# Patient Record
Sex: Male | Born: 1935 | Race: White | Hispanic: No | Marital: Married | Smoking: Former smoker
Health system: Southern US, Community
[De-identification: ages and names within clinical notes are randomized; demographics above are authoritative.]

## PROBLEM LIST (undated history)

## (undated) DIAGNOSIS — C4490 Unspecified malignant neoplasm of skin, unspecified: Secondary | ICD-10-CM

## (undated) DIAGNOSIS — C801 Malignant (primary) neoplasm, unspecified: Secondary | ICD-10-CM

## (undated) DIAGNOSIS — I4891 Unspecified atrial fibrillation: Secondary | ICD-10-CM

## (undated) DIAGNOSIS — I10 Essential (primary) hypertension: Secondary | ICD-10-CM

## (undated) DIAGNOSIS — I639 Cerebral infarction, unspecified: Secondary | ICD-10-CM

## (undated) DIAGNOSIS — I2510 Atherosclerotic heart disease of native coronary artery without angina pectoris: Secondary | ICD-10-CM

## (undated) DIAGNOSIS — E785 Hyperlipidemia, unspecified: Secondary | ICD-10-CM

## (undated) HISTORY — PX: FRACTURE SURGERY: SHX138

---

## 2008-09-17 ENCOUNTER — Inpatient Hospital Stay: Payer: Self-pay | Admitting: Internal Medicine

## 2009-07-27 ENCOUNTER — Inpatient Hospital Stay: Payer: Self-pay | Admitting: Internal Medicine

## 2010-09-14 ENCOUNTER — Ambulatory Visit: Payer: Self-pay | Admitting: Ophthalmology

## 2010-10-19 ENCOUNTER — Ambulatory Visit: Payer: Self-pay | Admitting: Ophthalmology

## 2011-07-19 IMAGING — CT CT CHEST W/O CM
1 series · 15 of 33 positions shown, 19 images · non-contrast
Comparison: none

REASON FOR EXAM: questionable b/l infiltrates
COMMENTS:

[Series 2: soft tissue · axial · 0.75mm/px · z∈[-892,-618]mm · 15 of 65 slices shown, 19 images]
[im 5/65  mediastinal]
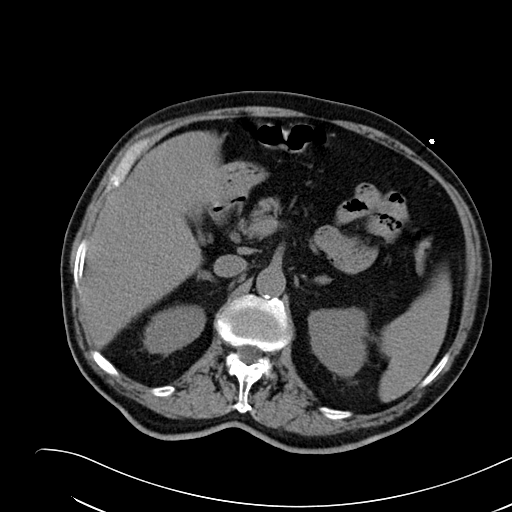
[im 5/65  lung]
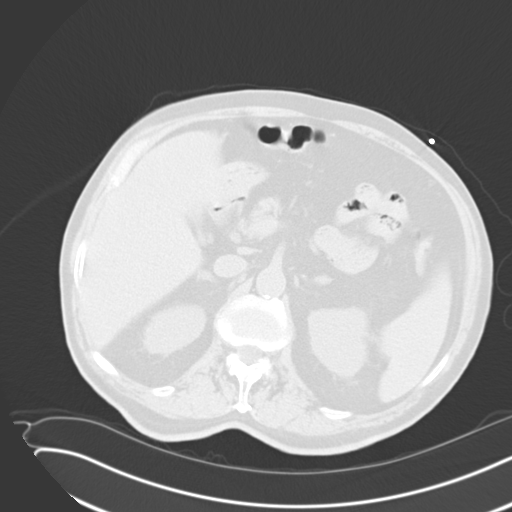
[im 10/65  lung]
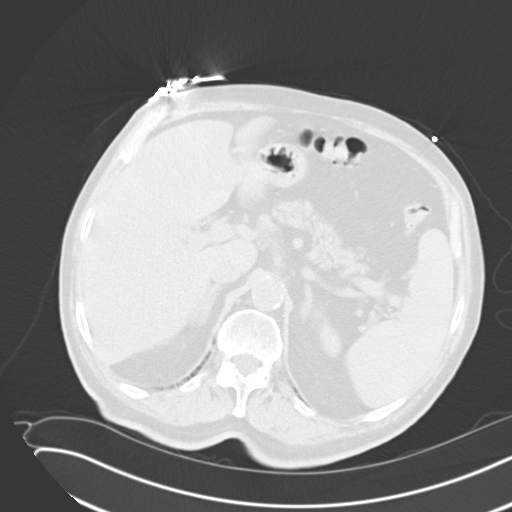
[im 13/65  lung]
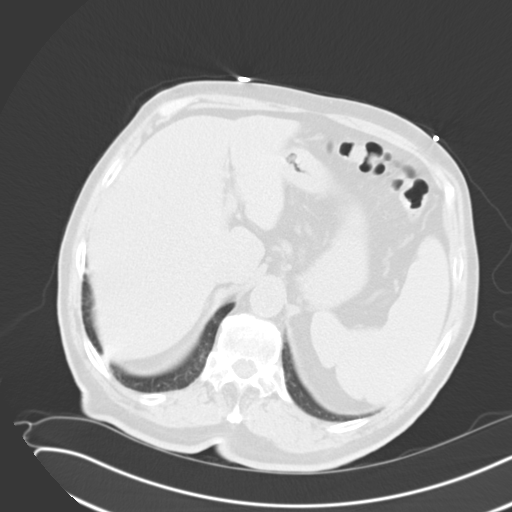
[im 17/65  lung]
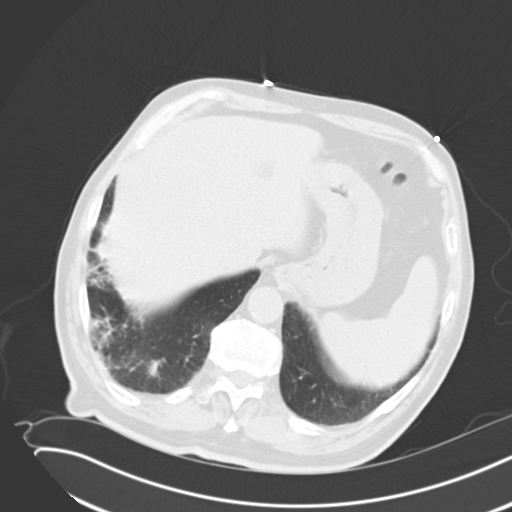
[im 22/65  mediastinal]
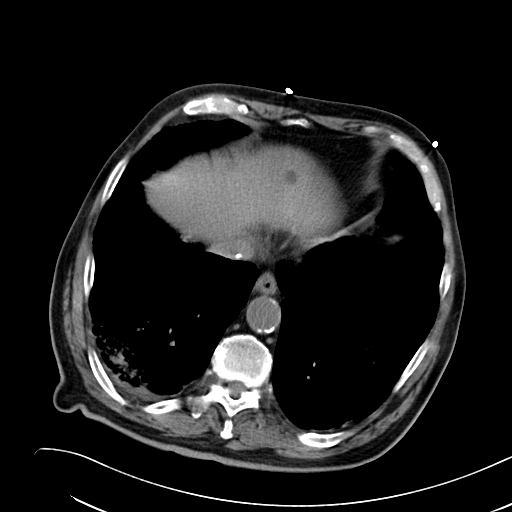
[im 22/65  lung]
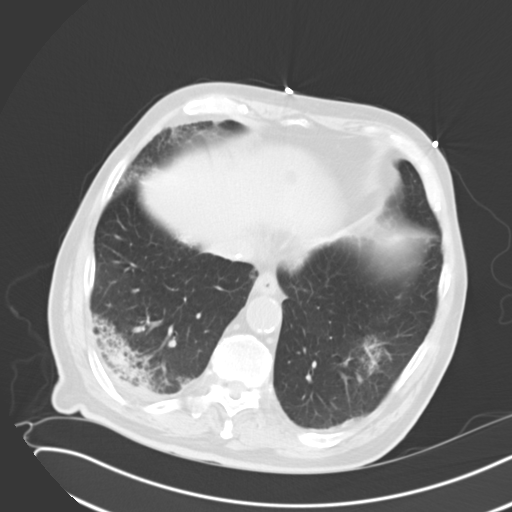
[im 26/65  lung]
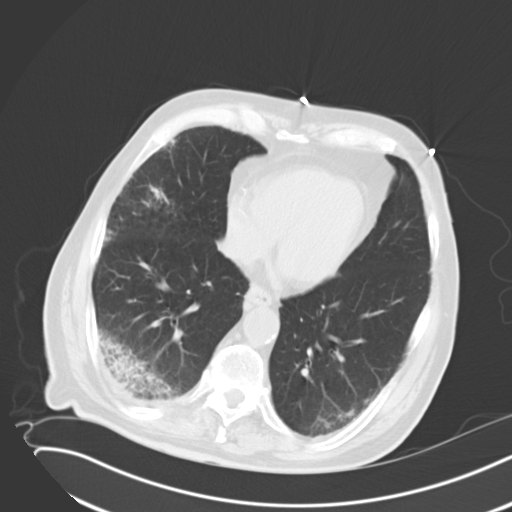
[im 29/65  lung]
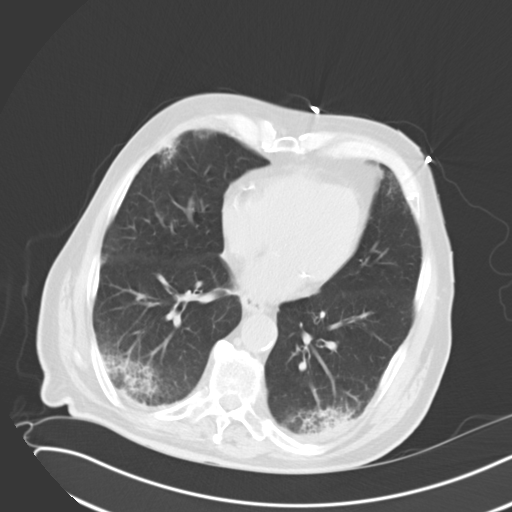
[im 34/65  lung]
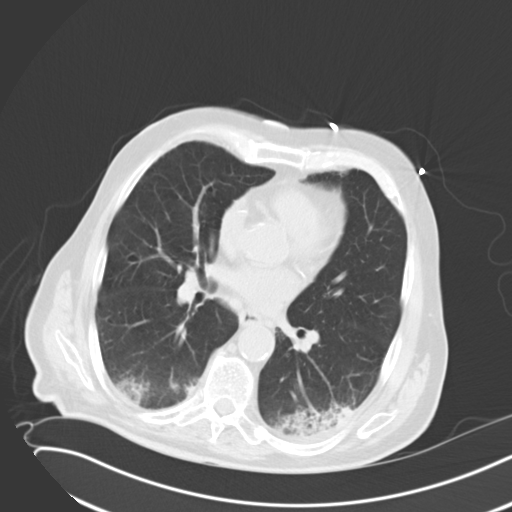
[im 36/65  mediastinal]
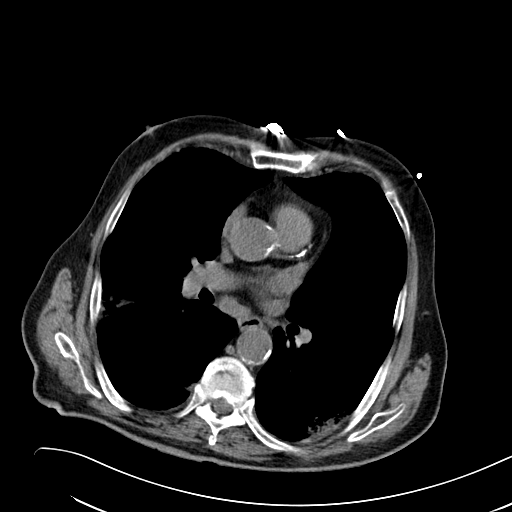
[im 36/65  lung]
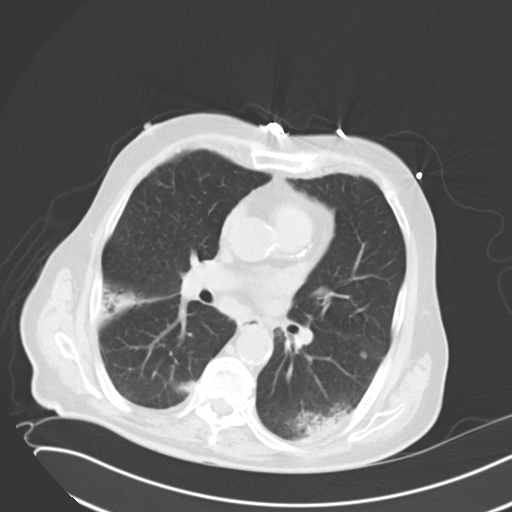
[im 39/65  lung]
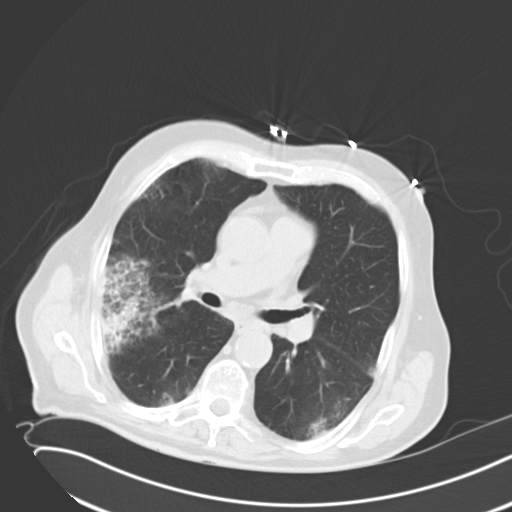
[im 43/65  lung]
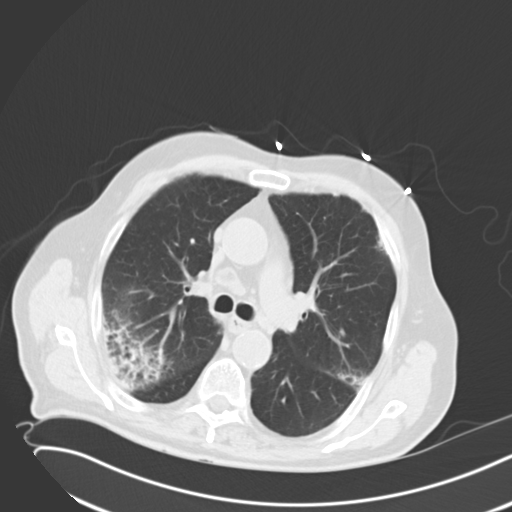
[im 48/65  lung]
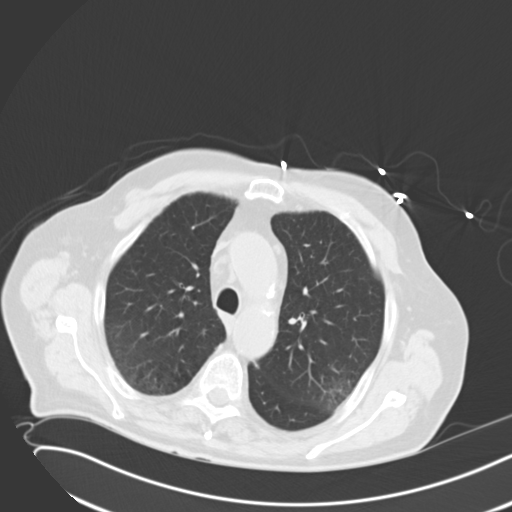
[im 52/65  mediastinal]
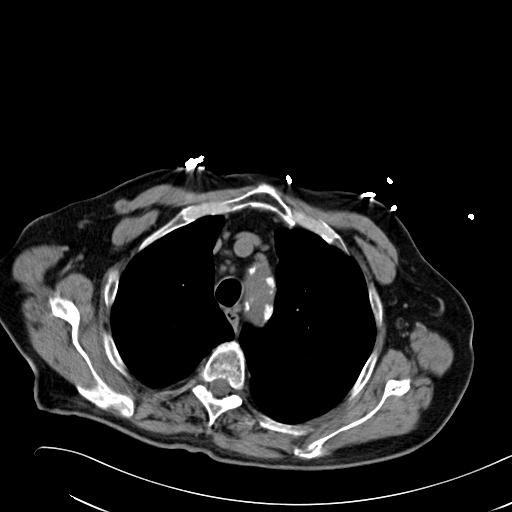
[im 52/65  lung]
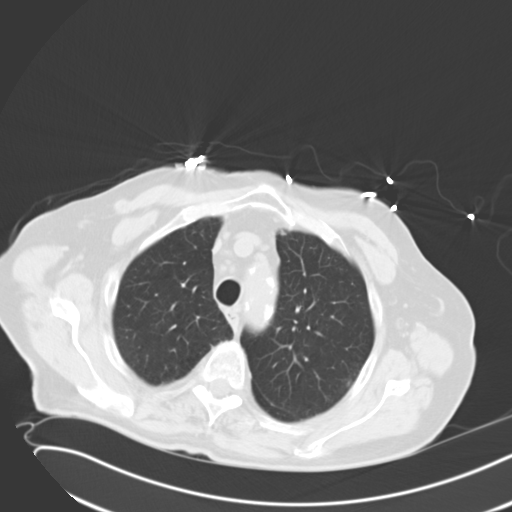
[im 55/65  lung]
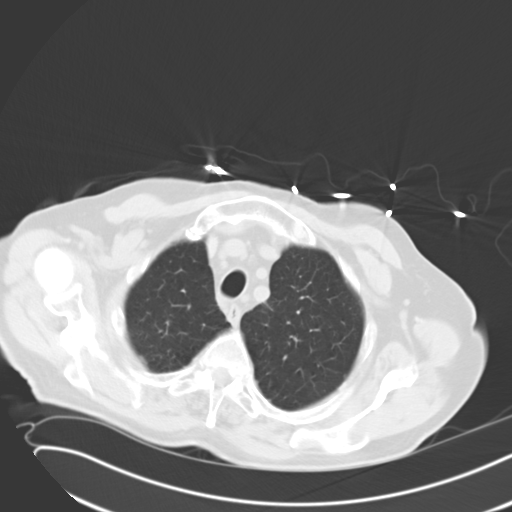
[im 60/65  lung]
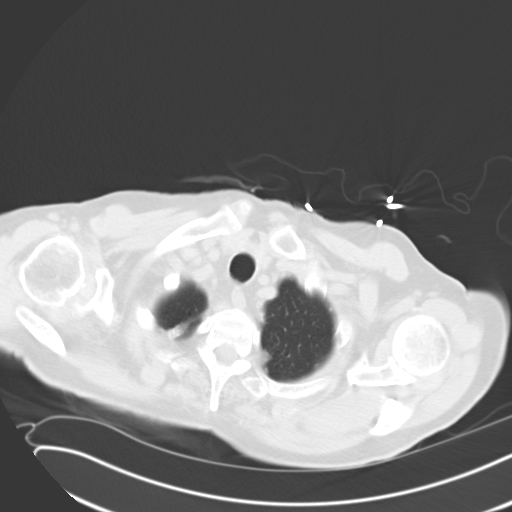

[15 of 33 positions shown; findings below may reference images not displayed]

PROCEDURE:     CT  - CT CHEST WITHOUT CONTRAST  - July 27, 2009  [DATE]

RESULT:     Noncontrast emergent CT of the chest is performed after chest
x-ray demonstrated bilateral pneumonia. CT confirms that there are bilateral
infiltrates in the right upper lobe along the minor fissure and major
fissure as well is in both lower lobes and minimally in the left upper lobe.
Underlying emphysematous changes are present. There is no discrete mass.
There is no significant pleural effusion. Some pleural thickening or
pleural-based atelectasis appears to be present in both lower lobes.
Atherosclerotic disease is present. Low-attenuation is seen in the liver
suggestive of possible small cysts. Shotty mediastinal lymph nodes are
present with a pretracheal/precarinal lymph node measuring up to 13 mm. AP
window lymph nodes are seen. These are nonspecific but certainly could be
reactive. No pericardial effusion is evident. The upper abdominal structures
otherwise appear grossly normal.
IMPRESSION: Findings suggestive of bilateral pneumonia as described.

## 2016-09-12 ENCOUNTER — Inpatient Hospital Stay
Admission: EM | Admit: 2016-09-12 | Discharge: 2016-09-18 | DRG: 871 | Disposition: A | Discharge status: Hospice | Payer: Medicare Other | Attending: Internal Medicine | Admitting: Internal Medicine

## 2016-09-12 ENCOUNTER — Emergency Department: Payer: Medicare Other

## 2016-09-12 ENCOUNTER — Inpatient Hospital Stay: Payer: Medicare Other

## 2016-09-12 DIAGNOSIS — F0150 Vascular dementia without behavioral disturbance: Secondary | ICD-10-CM | POA: Diagnosis not present

## 2016-09-12 DIAGNOSIS — J441 Chronic obstructive pulmonary disease with (acute) exacerbation: Secondary | ICD-10-CM | POA: Diagnosis not present

## 2016-09-12 DIAGNOSIS — Z66 Do not resuscitate: Secondary | ICD-10-CM | POA: Diagnosis present

## 2016-09-12 DIAGNOSIS — W1830XA Fall on same level, unspecified, initial encounter: Secondary | ICD-10-CM | POA: Diagnosis present

## 2016-09-12 DIAGNOSIS — J9601 Acute respiratory failure with hypoxia: Secondary | ICD-10-CM | POA: Diagnosis not present

## 2016-09-12 DIAGNOSIS — J440 Chronic obstructive pulmonary disease with acute lower respiratory infection: Secondary | ICD-10-CM | POA: Diagnosis present

## 2016-09-12 DIAGNOSIS — A419 Sepsis, unspecified organism: Secondary | ICD-10-CM | POA: Diagnosis not present

## 2016-09-12 DIAGNOSIS — R410 Disorientation, unspecified: Secondary | ICD-10-CM | POA: Diagnosis present

## 2016-09-12 DIAGNOSIS — Z7401 Bed confinement status: Secondary | ICD-10-CM

## 2016-09-12 DIAGNOSIS — E872 Acidosis: Secondary | ICD-10-CM | POA: Diagnosis present

## 2016-09-12 DIAGNOSIS — Z6827 Body mass index (BMI) 27.0-27.9, adult: Secondary | ICD-10-CM | POA: Diagnosis not present

## 2016-09-12 DIAGNOSIS — J189 Pneumonia, unspecified organism: Secondary | ICD-10-CM

## 2016-09-12 DIAGNOSIS — J181 Lobar pneumonia, unspecified organism: Secondary | ICD-10-CM

## 2016-09-12 DIAGNOSIS — Z515 Encounter for palliative care: Secondary | ICD-10-CM | POA: Diagnosis not present

## 2016-09-12 DIAGNOSIS — J9621 Acute and chronic respiratory failure with hypoxia: Secondary | ICD-10-CM | POA: Diagnosis present

## 2016-09-12 DIAGNOSIS — I110 Hypertensive heart disease with heart failure: Secondary | ICD-10-CM | POA: Diagnosis present

## 2016-09-12 DIAGNOSIS — E785 Hyperlipidemia, unspecified: Secondary | ICD-10-CM | POA: Diagnosis present

## 2016-09-12 DIAGNOSIS — I482 Chronic atrial fibrillation: Secondary | ICD-10-CM | POA: Diagnosis present

## 2016-09-12 DIAGNOSIS — Z7189 Other specified counseling: Secondary | ICD-10-CM

## 2016-09-12 DIAGNOSIS — E669 Obesity, unspecified: Secondary | ICD-10-CM | POA: Diagnosis present

## 2016-09-12 DIAGNOSIS — Z79899 Other long term (current) drug therapy: Secondary | ICD-10-CM

## 2016-09-12 DIAGNOSIS — F40240 Claustrophobia: Secondary | ICD-10-CM | POA: Diagnosis present

## 2016-09-12 DIAGNOSIS — Y92009 Unspecified place in unspecified non-institutional (private) residence as the place of occurrence of the external cause: Secondary | ICD-10-CM | POA: Diagnosis not present

## 2016-09-12 DIAGNOSIS — I429 Cardiomyopathy, unspecified: Secondary | ICD-10-CM | POA: Diagnosis present

## 2016-09-12 DIAGNOSIS — I509 Heart failure, unspecified: Secondary | ICD-10-CM | POA: Diagnosis present

## 2016-09-12 DIAGNOSIS — R296 Repeated falls: Secondary | ICD-10-CM | POA: Diagnosis present

## 2016-09-12 DIAGNOSIS — I2510 Atherosclerotic heart disease of native coronary artery without angina pectoris: Secondary | ICD-10-CM | POA: Diagnosis present

## 2016-09-12 DIAGNOSIS — R2681 Unsteadiness on feet: Secondary | ICD-10-CM | POA: Diagnosis present

## 2016-09-12 DIAGNOSIS — J690 Pneumonitis due to inhalation of food and vomit: Secondary | ICD-10-CM | POA: Diagnosis present

## 2016-09-12 DIAGNOSIS — Z8673 Personal history of transient ischemic attack (TIA), and cerebral infarction without residual deficits: Secondary | ICD-10-CM

## 2016-09-12 DIAGNOSIS — Z87891 Personal history of nicotine dependence: Secondary | ICD-10-CM | POA: Diagnosis not present

## 2016-09-12 DIAGNOSIS — Z7901 Long term (current) use of anticoagulants: Secondary | ICD-10-CM

## 2016-09-12 DIAGNOSIS — R0602 Shortness of breath: Secondary | ICD-10-CM

## 2016-09-12 HISTORY — DX: Malignant (primary) neoplasm, unspecified: C80.1

## 2016-09-12 HISTORY — DX: Atherosclerotic heart disease of native coronary artery without angina pectoris: I25.10

## 2016-09-12 HISTORY — DX: Unspecified atrial fibrillation: I48.91

## 2016-09-12 HISTORY — DX: Cerebral infarction, unspecified: I63.9

## 2016-09-12 HISTORY — DX: Essential (primary) hypertension: I10

## 2016-09-12 HISTORY — DX: Hyperlipidemia, unspecified: E78.5

## 2016-09-12 HISTORY — DX: Unspecified malignant neoplasm of skin, unspecified: C44.90

## 2016-09-12 LAB — COMPREHENSIVE METABOLIC PANEL
ALBUMIN: 3.9 g/dL (ref 3.5–5.0)
ALT: 20 U/L (ref 17–63)
ALT: 19 U/L (ref 17–63)
ANION GAP: 10 (ref 5–15)
AST: 26 U/L (ref 15–41)
AST: 23 U/L (ref 15–41)
Albumin: 4.2 g/dL (ref 3.5–5.0)
Alkaline Phosphatase: 63 U/L (ref 38–126)
Alkaline Phosphatase: 53 U/L (ref 38–126)
Anion gap: 12 (ref 5–15)
BILIRUBIN TOTAL: 1.1 mg/dL (ref 0.3–1.2)
BUN: 17 mg/dL (ref 6–20)
BUN: 16 mg/dL (ref 6–20)
CALCIUM: 9.1 mg/dL (ref 8.9–10.3)
CHLORIDE: 100 mmol/L — AB (ref 101–111)
CHLORIDE: 99 mmol/L — AB (ref 101–111)
CO2: 25 mmol/L (ref 22–32)
CO2: 22 mmol/L (ref 22–32)
Calcium: 8.9 mg/dL (ref 8.9–10.3)
Creatinine, Ser: 0.8 mg/dL (ref 0.61–1.24)
Creatinine, Ser: 0.89 mg/dL (ref 0.61–1.24)
GFR calc Af Amer: >60 mL/min (ref >60)
GFR calc non Af Amer: >60 mL/min (ref >60)
GLUCOSE: 127 mg/dL — AB (ref 65–99)
Glucose, Bld: 123 mg/dL — ABNORMAL HIGH (ref 65–99)
POTASSIUM: 4.1 mmol/L (ref 3.5–5.1)
Potassium: 4.2 mmol/L (ref 3.5–5.1)
SODIUM: 135 mmol/L (ref 135–145)
Sodium: 133 mmol/L — ABNORMAL LOW (ref 135–145)
Total Bilirubin: 1.5 mg/dL — ABNORMAL HIGH (ref 0.3–1.2)
Total Protein: 8.2 g/dL — ABNORMAL HIGH (ref 6.5–8.1)
Total Protein: 7.5 g/dL (ref 6.5–8.1)

## 2016-09-12 LAB — LACTIC ACID, PLASMA
LACTIC ACID, VENOUS: 2.3 mmol/L — AB (ref 0.5–1.9)
LACTIC ACID, VENOUS: 2.2 mmol/L — AB (ref 0.5–1.9)
Lactic Acid, Venous: 2.3 mmol/L (ref 0.5–1.9)
Lactic Acid, Venous: 3.5 mmol/L (ref 0.5–1.9)

## 2016-09-12 LAB — CBC WITH DIFFERENTIAL/PLATELET
BASOS ABS: 0 10*3/uL (ref 0–0.1)
BASOS ABS: 0 10*3/uL (ref 0–0.1)
BASOS PCT: 1 %
BASOS PCT: 0 %
EOS PCT: 0 %
Eosinophils Absolute: 0 10*3/uL (ref 0–0.7)
Eosinophils Absolute: 0 10*3/uL (ref 0–0.7)
Eosinophils Relative: 0 %
HCT: 48.5 % (ref 40.0–52.0)
HEMATOCRIT: 46.2 % (ref 40.0–52.0)
Hemoglobin: 16.2 g/dL (ref 13.0–18.0)
Hemoglobin: 15.9 g/dL (ref 13.0–18.0)
LYMPHS PCT: 8 %
Lymphocytes Relative: 6 %
Lymphs Abs: 0.4 10*3/uL — ABNORMAL LOW (ref 1.0–3.6)
Lymphs Abs: 0.3 10*3/uL — ABNORMAL LOW (ref 1.0–3.6)
MCH: 30 pg (ref 26.0–34.0)
MCH: 30.5 pg (ref 26.0–34.0)
MCHC: 33.5 g/dL (ref 32.0–36.0)
MCHC: 34.3 g/dL (ref 32.0–36.0)
MCV: 89.6 fL (ref 80.0–100.0)
MCV: 88.7 fL (ref 80.0–100.0)
MONO ABS: 0.4 10*3/uL (ref 0.2–1.0)
MONO ABS: 0.1 10*3/uL — AB (ref 0.2–1.0)
Monocytes Relative: 8 %
Monocytes Relative: 3 %
NEUTROS ABS: 4.2 10*3/uL (ref 1.4–6.5)
NEUTROS ABS: 4 10*3/uL (ref 1.4–6.5)
Neutrophils Relative %: 83 %
Neutrophils Relative %: 91 %
PLATELETS: 124 10*3/uL — AB (ref 150–440)
PLATELETS: 119 10*3/uL — AB (ref 150–440)
RBC: 5.41 MIL/uL (ref 4.40–5.90)
RBC: 5.21 MIL/uL (ref 4.40–5.90)
RDW: 15.6 % — AB (ref 11.5–14.5)
RDW: 15.7 % — AB (ref 11.5–14.5)
WBC: 5 10*3/uL (ref 3.8–10.6)
WBC: 4.4 10*3/uL (ref 3.8–10.6)

## 2016-09-12 LAB — URINALYSIS, ROUTINE W REFLEX MICROSCOPIC
Bacteria, UA: NONE SEEN
Bilirubin Urine: NEGATIVE
GLUCOSE, UA: NEGATIVE mg/dL
Ketones, ur: 5 mg/dL — AB
Leukocytes, UA: NEGATIVE
NITRITE: NEGATIVE
PH: 5 (ref 5.0–8.0)
Protein, ur: 30 mg/dL — AB
SPECIFIC GRAVITY, URINE: 1.016 (ref 1.005–1.030)

## 2016-09-12 LAB — PROCALCITONIN: Procalcitonin: <0.1 ng/mL

## 2016-09-12 LAB — PROTIME-INR
INR: 1.95
INR: 1.67
PROTHROMBIN TIME: 19.9 s — AB (ref 11.4–15.2)
Prothrombin Time: 22.5 seconds — ABNORMAL HIGH (ref 11.4–15.2)

## 2016-09-12 LAB — APTT: APTT: 36 s (ref 24–36)

## 2016-09-12 LAB — MRSA PCR SCREENING: MRSA by PCR: NEGATIVE

## 2016-09-12 LAB — GLUCOSE, CAPILLARY: Glucose-Capillary: 126 mg/dL — ABNORMAL HIGH (ref 65–99)

## 2016-09-12 MED ORDER — AMLODIPINE BESYLATE 5 MG PO TABS
5.0000 mg | ORAL_TABLET | Freq: Every day | ORAL | Status: DC
  Administered 2016-09-12 – 2016-09-15 (×3): 5 mg via ORAL
  Filled 2016-09-12 (×3): qty 1

## 2016-09-12 MED ORDER — DEXTROSE 5 % IV SOLN: 1.0000 g | INTRAVENOUS | Status: DC

## 2016-09-12 MED ORDER — CEFTRIAXONE SODIUM 1 G IJ SOLR
1.0000 g | Freq: Once | INTRAMUSCULAR | Status: AC
  Administered 2016-09-12: 1 g via INTRAVENOUS
  Filled 2016-09-12: qty 10

## 2016-09-12 MED ORDER — SODIUM CHLORIDE 0.9 % IV BOLUS (SEPSIS)
1000.0000 mL | Freq: Once | INTRAVENOUS | Status: AC
  Administered 2016-09-12: 1000 mL via INTRAVENOUS

## 2016-09-12 MED ORDER — LISINOPRIL 5 MG PO TABS
5.0000 mg | ORAL_TABLET | Freq: Every day | ORAL | Status: DC
  Administered 2016-09-12 – 2016-09-15 (×3): 5 mg via ORAL
  Filled 2016-09-12 (×3): qty 1

## 2016-09-12 MED ORDER — SODIUM CHLORIDE 0.9 % IV BOLUS (SEPSIS)
500.0000 mL | Freq: Once | INTRAVENOUS | Status: AC
  Administered 2016-09-12: 500 mL via INTRAVENOUS

## 2016-09-12 MED ORDER — METHYLPREDNISOLONE SODIUM SUCC 40 MG IJ SOLR
40.0000 mg | Freq: Two times a day (BID) | INTRAMUSCULAR | Status: DC
  Administered 2016-09-12: 40 mg via INTRAVENOUS
  Filled 2016-09-12: qty 1

## 2016-09-12 MED ORDER — CEFTRIAXONE SODIUM 1 G IJ SOLR: 1.0000 g | Freq: Once | INTRAMUSCULAR | Status: DC

## 2016-09-12 MED ORDER — ASPIRIN 81 MG PO CHEW
CHEWABLE_TABLET | ORAL | Status: AC
  Filled 2016-09-12: qty 3

## 2016-09-12 MED ORDER — DEXTROSE 5 % IV SOLN
500.0000 mg | INTRAVENOUS | Status: DC
  Administered 2016-09-13 – 2016-09-15 (×3): 500 mg via INTRAVENOUS
  Filled 2016-09-12 (×3): qty 500

## 2016-09-12 MED ORDER — ONDANSETRON HCL 4 MG/2ML IJ SOLN: 4.0000 mg | Freq: Four times a day (QID) | INTRAMUSCULAR | Status: DC | PRN

## 2016-09-12 MED ORDER — IPRATROPIUM-ALBUTEROL 0.5-2.5 (3) MG/3ML IN SOLN
3.0000 mL | RESPIRATORY_TRACT | Status: DC
  Administered 2016-09-12 (×5): 3 mL via RESPIRATORY_TRACT
  Filled 2016-09-12 (×5): qty 3

## 2016-09-12 MED ORDER — METHYLPREDNISOLONE SODIUM SUCC 40 MG IJ SOLR
40.0000 mg | Freq: Every day | INTRAMUSCULAR | Status: DC
  Administered 2016-09-13 – 2016-09-14 (×2): 40 mg via INTRAVENOUS
  Filled 2016-09-12 (×2): qty 1

## 2016-09-12 MED ORDER — ASPIRIN 81 MG PO CHEW
324.0000 mg | CHEWABLE_TABLET | ORAL | Status: AC
  Administered 2016-09-12: 324 mg via ORAL
  Filled 2016-09-12: qty 4

## 2016-09-12 MED ORDER — ASPIRIN 300 MG RE SUPP
300.0000 mg | RECTAL | Status: AC
Start: 2016-09-12 — End: 2016-09-12

## 2016-09-12 MED ORDER — ENOXAPARIN SODIUM 40 MG/0.4ML ~~LOC~~ SOLN: 40.0000 mg | SUBCUTANEOUS | Status: DC

## 2016-09-12 MED ORDER — PANTOPRAZOLE SODIUM 40 MG PO TBEC
40.0000 mg | DELAYED_RELEASE_TABLET | Freq: Every day | ORAL | Status: DC
  Administered 2016-09-12 – 2016-09-13 (×2): 40 mg via ORAL
  Filled 2016-09-12 (×2): qty 1

## 2016-09-12 MED ORDER — METOPROLOL TARTRATE 25 MG PO TABS
25.0000 mg | ORAL_TABLET | Freq: Two times a day (BID) | ORAL | Status: DC
  Administered 2016-09-12 – 2016-09-15 (×5): 25 mg via ORAL
  Filled 2016-09-12 (×5): qty 1

## 2016-09-12 MED ORDER — RIVAROXABAN 20 MG PO TABS
20.0000 mg | ORAL_TABLET | Freq: Every day | ORAL | Status: DC
  Administered 2016-09-12 – 2016-09-14 (×3): 20 mg via ORAL
  Filled 2016-09-12 (×4): qty 1

## 2016-09-12 MED ORDER — ACETAMINOPHEN 325 MG PO TABS: 650.0000 mg | ORAL_TABLET | ORAL | Status: DC | PRN

## 2016-09-12 MED ORDER — BUDESONIDE 0.5 MG/2ML IN SUSP
0.5000 mg | Freq: Two times a day (BID) | RESPIRATORY_TRACT | Status: DC
  Administered 2016-09-12 – 2016-09-15 (×7): 0.5 mg via RESPIRATORY_TRACT
  Filled 2016-09-12 (×8): qty 2

## 2016-09-12 MED ORDER — DEXTROSE 5 % IV SOLN
500.0000 mg | Freq: Once | INTRAVENOUS | Status: AC
  Administered 2016-09-12: 500 mg via INTRAVENOUS
  Filled 2016-09-12: qty 500

## 2016-09-12 MED ORDER — AZITHROMYCIN 500 MG IV SOLR: 500.0000 mg | Freq: Once | INTRAVENOUS | Status: DC

## 2016-09-12 NOTE — ED Notes (Signed)
Report to stephanie, rn

## 2016-09-12 NOTE — ED Notes (Signed)
Attempted to obtain a urine specimen, pt is unable to give urine specimen at this time, family is at the bedside and is aware that we need a specimen.Marland Kitchen

## 2016-09-12 NOTE — ED Notes (Signed)
Care hand off to [place], RN in CCU at this time

## 2016-09-12 NOTE — Consult Note (Signed)
[facility] Consultation Note  Patient ID: Steven Medina, MRN: [id number], DOB/AGE: June 18, 1927 83 y.o. Admit date: 09/12/2016   Date of Consult: 09/12/2016 Primary Physician: Leonel Ramsay, MD Primary Cardiologist: Nehemiah Massed  Chief Complaint:  Chief Complaint  Patient presents with  . Fall   Reason for Consult: atrial fibrillation with rapid ventricular rate  HPI: 83 y.o. male with known paroxysmal nonvalvular atrial fibrillation coronary artery disease essential hypertension makes hyperlipidemia and previous stroke who has had severe progressive shortness of breath weakness cough and congestion over the last week. He was so weak he could not stand up and was then transported to the hospital. At that time the patient does appear to have had a change in his lung consistent with pneumonia and possible concerns for pulmonary edema as well. The patient appears to have atrial fibrillation with rapid ventricular rate by EKG. Previously he is had appropriate medication management for hypertension control of atrial fibrillation control with metoprolol and risk reduction for stroke with atrial fibrillation has medications listed below. With this new onset of sepsis the patient has atrial fibrillation with rapid ventricular rate and congestive heart failure. Currently he has had some improvements with the some diuresis and oxygenation  Past Medical History:  Diagnosis Date  . A-fib ([place])   . Cancer ([place])   . Coronary artery disease   . Hyperlipemia   . Hypertension   . Skin cancer   . Stroke [facility])       Surgical History:  Past Surgical History:  Procedure Laterality Date  . FRACTURE SURGERY       Home Meds: Prior to Admission medications   Medication Sig Start Date End Date Taking? Authorizing Provider  amLODipine (NORVASC) 5 MG tablet Take 5 mg by mouth daily.   Yes [provider]  benzonatate (TESSALON) 100 MG capsule Take 100 capsules by mouth 3 (three)  times daily. 09/11/16 09/21/16 Yes [provider]  latanoprost (XALATAN) 0.005 % ophthalmic solution 1 drop at bedtime.   Yes [provider]  lisinopril (PRINIVIL,ZESTRIL) 5 MG tablet Take 5 mg by mouth daily.   Yes [provider]  metoprolol tartrate (LOPRESSOR) 25 MG tablet Take 25 mg by mouth 2 (two) times daily.   Yes [provider]  Omega-3 Fatty Acids (FISH OIL PO) Take 1 capsule by mouth daily.   Yes [provider]  potassium chloride (MICRO-K) 10 MEQ CR capsule Take 10 mEq by mouth 2 (two) times daily.   Yes [provider]  rivaroxaban (XARELTO) 20 MG TABS tablet Take 20 mg by mouth daily with supper.   Yes [provider]  timolol (TIMOPTIC) 0.5 % ophthalmic solution 1 drop 2 (two) times daily.   Yes [provider]    Inpatient Medications:  . amLODipine  5 mg Oral Daily  . aspirin  324 mg Oral NOW   Or  . aspirin  300 mg Rectal NOW  . budesonide (PULMICORT) nebulizer solution  0.5 mg Nebulization BID  . ipratropium-albuterol  3 mL Nebulization Q4H  . lisinopril  5 mg Oral Daily  . [START ON 09/13/2016] methylPREDNISolone (SOLU-MEDROL) injection  40 mg Intravenous Daily  . metoprolol tartrate  25 mg Oral BID  . pantoprazole  40 mg Oral Daily  . rivaroxaban  20 mg Oral Q supper   . [START ON 09/13/2016] azithromycin    . sodium chloride     And  . sodium chloride      Allergies: No Known  Allergies  Social History   Social History  . Marital status: Married    Spouse name: N/A  . Number of children: N/A  . Years of education: N/A   Occupational History  . Not on file.   Social History Main Topics  . Smoking status: Former Research scientist (life sciences)  . Smokeless tobacco: Former Systems developer  . Alcohol use 0.6 oz/week    1 Glasses of wine per week  . Drug use: No  . Sexual activity: Not on file   Other Topics Concern  . Not on file   Social History Narrative  . No narrative on file     No family history on  file.   Review of Systems Positive for Cough congestion palpitations dizziness and presyncope Negative for: General:  chills, fever, night sweats or weight changes.  Cardiovascular: PND orthopnea positive for syncope dizziness  Dermatological skin lesions rashes Respiratory: Positive for Cough congestion Urologic: Frequent urination urination at night and hematuria Abdominal: negative for nausea, vomiting, diarrhea, bright red blood per rectum, melena, or hematemesis Neurologic: negative for visual changes, and/or hearing changes  All other systems reviewed and are otherwise negative except as noted above.  Labs: No results for input(s): CKTOTAL, CKMB, TROPONINI in the last 72 hours. Lab Results  Component Value Date   WBC 4.4 09/12/2016   HGB 15.9 09/12/2016   HCT 46.2 09/12/2016   MCV 88.7 09/12/2016   PLT 119 (L) 09/12/2016    Recent Labs Lab 09/12/16 1246  NA 133*  K 4.1  CL 99*  CO2 22  BUN 16  CREATININE 0.89  CALCIUM 8.9  PROT 7.5  BILITOT 1.1  ALKPHOS 53  ALT 19  AST 23  GLUCOSE 127*   No results found for: CHOL, HDL, LDLCALC, TRIG No results found for: DDIMER  Radiology/Studies:  Dg Chest 2 View  Result Date: 09/12/2016 CLINICAL DATA:  Fall, pneumonia, possible sepsis EXAM: CHEST  2 VIEW COMPARISON:  07/28/2009 chest radiograph. FINDINGS: Stable cardiomediastinal silhouette with top-normal heart size and aortic atherosclerosis. No pneumothorax. No pleural effusion. Patchy consolidation at the right lung base. No pulmonary edema. Healed deformities in the lateral right ribs. Deformity in the right scapular body appears healed. IMPRESSION: Patchy right lung base consolidation compatible with pneumonia. Follow-up chest imaging to resolution recommended. Electronically Signed   By: Ilona Sorrel M.D.   On: 09/12/2016 07:34    EKG: Atrial fibrillation with rapid ventricular rate  Weights: Filed Weights   09/12/16 0627  Weight: 79.4 kg (175 lb)      Physical Exam: Blood pressure 133/82, pulse (!) 110, temperature 98.2 F (36.8 C), temperature source Oral, resp. rate (!) 28, height 5\' 7"  (1.702 m), weight 79.4 kg (175 lb), SpO2 91 %. Body mass index is 27.41 kg/m. General: Well developed, well nourished, in no acute distress. Head eyes ears nose throat: Normocephalic, atraumatic, sclera non-icteric, no xanthomas, nares are without discharge. No apparent thyromegaly and/or mass  Lungs: Normal respiratory effort.  Diffuse wheezes, basilar rales, no rhonchi.  Heart: Irregular with normal S1 S2. no murmur gallop, no rub, PMI is normal size and placement, carotid upstroke normal without bruit, jugular venous pressure is normal Abdomen: Soft, non-tender, non-distended with normoactive bowel sounds. No hepatomegaly. No rebound/guarding. No obvious abdominal masses. Abdominal aorta is normal size without bruit Extremities: Trace to 1+ edema. no cyanosis, no clubbing, no ulcers  Peripheral : 2+ bilateral upper extremity pulses, 2+ bilateral femoral pulses, 2+ bilateral dorsal pedal pulse Neuro: Alert and oriented. No  facial asymmetry. No focal deficit. Moves all extremities spontaneously. Musculoskeletal: Normal muscle tone without kyphosis Psych:  Responds to questions appropriately with a normal affect.    Assessment: 83 year old male with known coronary artery disease atrial fibrillation essential hypertension makes hyperlipidemia with sepsis and some pulmonary edema needing further medical management  Plan: 1. Continue supportive care of possible pneumonia sepsis and or pulmonary edema 2. Oxygenation for hypoxia and shortness of breath 3. Continue metoprolol for heart rate control of atrial fibrillation and increased dose as able watching closely for hypotension and also would consider the possibility of diltiazem drip if necessary for goal heart rate below 110 bpm 4. Continue anticoagulation as able with current no evidence of bleeding  or anemia for risk reduction in stroke with atrial fibrillation 5. Reinstatement of the lisinopril and amlodipine as necessary when patient has higher blood pressure 6. Diagnostic testing as necessary after stabilization of above  Signed, Corey Skains M.D. [facility] 09/12/2016, 5:06 PM

## 2016-09-12 NOTE — Clinical Social Work Note (Signed)
Nursing consulted CSW in order to find out who patient's responsible party was. Patient's wife is documented with her contact information on patient's face sheet and staff has spoken with patient's wife twice on this admission. Shela Leff MSW,LCSW [phone]

## 2016-09-12 NOTE — Progress Notes (Signed)
Family Meeting Note  Advance Directive:yes  Today a meeting took place with the Patient, spouse and Daughter.  The following clinical team members were present during this meeting:MD  The following were discussed:Patient's diagnosis: , Patient's progosis: < 12 months and Goals for treatment: DNR  Additional follow-up to be provided: Palliative care consultation  Time spent during discussion:20 minutes  Max Sane, MD

## 2016-09-12 NOTE — ED Provider Notes (Signed)
[facility] Emergency Department Provider Note   ____________________________________________    I have reviewed the triage vital signs and the nursing notes.   HISTORY  Chief Complaint Weakness and fall  Patient has a history of dementia, history is per wife  HPI Steven Medina is a 83 y.o. male who presents with weakness and a fall. Wife reports that patient has dementia and may have had a mini stroke about 6 months ago which has left him with some difficulty with balance. He has gone through physical therapy and rehabilitation and currently receives PT and OT at home. He has been doing relatively well. 3 days ago he developed a cough and decreased by mouth intake. Earlier this morning he became unsteady on his feet and she was able to help him to the floor. He did not fall or injure himself. He is on blood thinners for atrial fibrillation.    No past medical history on file.  There are no active problems to display for this patient.   No past surgical history on file.  Prior to Admission medications   Not on File     Allergies Patient has no known allergies.  No family history on file.  Social History Social History  Substance Use Topics  . Smoking status: Not on file  . Smokeless tobacco: Not on file  . Alcohol use Not on file    Review of Systems Limited, per wife  Constitutional: Positive fever Eyes: No discharge ENT: No neck pain Cardiovascular: Denies chest pain. Respiratory: Positive cough Gastrointestinal:  no vomiting.   Genitourinary: Negative for foul-smelling urine Musculoskeletal: Negative for back pain or hip pain Skin: Negative for abrasion or laceration Neurological: Negative for new weakness   ____________________________________________   PHYSICAL EXAM:  VITAL SIGNS: ED Triage Vitals  Enc Vitals Group     BP 09/12/16 0623 (!) 147/82     Pulse --      Resp 09/12/16 0623 (!) 29     Temp 09/12/16 0623  100.2 F (37.9 C)     Temp Source 09/12/16 0623 Oral     SpO2 09/12/16 0623 98 %     Weight 09/12/16 0627 79.4 kg (175 lb)     Height 09/12/16 0627 1.702 m (5\' 7" )     Head Circumference --      Peak Flow --      Pain Score --      Pain Loc --      Pain Edu? --      Excl. in [place]? --     Constitutional: Alert. No acute distress.  Eyes: Conjunctivae are normal.  Head: Atraumatic. Nose: No congestion/rhinnorhea. Mouth/Throat: Mucous membranes are moist.   Neck:  Painless ROM, No vertebral tenderness to palpation Cardiovascular: Tachycardia, irregularly irregular rhythm.   Good peripheral circulation. Respiratory: Mild tachypnea.  No retractions. Bibasilar rales Gastrointestinal: Soft and nontender. No distention.  No CVA tenderness. Genitourinary: deferred Musculoskeletal: No lower extremity tenderness nor edema.  Warm and well perfused Neurologic:  Normal speech and language. No gross focal neurologic deficits are appreciated.  Skin:  Skin is warm, dry and intact. No rash noted. Psychiatric: Mood and affect are normal. Speech and behavior are normal.  ____________________________________________   LABS (all labs ordered are listed, but only abnormal results are displayed)  Labs Reviewed  COMPREHENSIVE METABOLIC PANEL - Abnormal; Notable for the following:       Result Value   Chloride 100 (*)    Glucose,  Bld 123 (*)    Total Protein 8.2 (*)    Total Bilirubin 1.5 (*)    All other components within normal limits  LACTIC ACID, PLASMA - Abnormal; Notable for the following:    Lactic Acid, Venous 2.3 (*)    All other components within normal limits  CBC WITH DIFFERENTIAL/PLATELET - Abnormal; Notable for the following:    RDW 15.6 (*)    Platelets 124 (*)    Lymphs Abs 0.4 (*)    All other components within normal limits  PROTIME-INR - Abnormal; Notable for the following:    Prothrombin Time 22.5 (*)    All other components within normal limits  CULTURE, BLOOD (ROUTINE X  2)  CULTURE, BLOOD (ROUTINE X 2)  CULTURE, BLOOD (ROUTINE X 2)  CULTURE, BLOOD (ROUTINE X 2)  URINE CULTURE  LACTIC ACID, PLASMA  URINALYSIS, ROUTINE W REFLEX MICROSCOPIC   ____________________________________________  EKG  ED ECG REPORT I, Lavonia Drafts, the attending physician, personally viewed and interpreted this ECG.  Date: 09/12/2016 EKG Time: 6:24 AM Rate: *119 Rhythm: Atrial fibrillation QRS Axis: normal Intervals: Abnormal ST/T Wave abnormalities: Nonspecific   ____________________________________________  RADIOLOGY  Right lower lobe pneumonia ____________________________________________   PROCEDURES  Procedure(s) performed: No    Critical Care performed:No ____________________________________________   INITIAL IMPRESSION / ASSESSMENT AND PLAN / ED COURSE  Pertinent labs & imaging results that were available during my care of the patient were reviewed by me and considered in my medical decision making (see chart for details).  Patient presents with tachycardia, elevated temperature, tachypnea and increasing weakness. Concern for infection/possible sepsis. No significant injury from "fall" as wife was able to carefully help him to the floor. Lab work demonstrates elevated lactic acid and chest x-ray is consistent with pneumonia, IV antibiotics ordered, code sepsis called    ____________________________________________   FINAL CLINICAL IMPRESSION(S) / ED DIAGNOSES  Final diagnoses:  Community acquired pneumonia of right lower lobe of lung ([place])  Sepsis, due to unspecified organism [facility])      NEW MEDICATIONS STARTED DURING THIS VISIT:  New Prescriptions   No medications on file     Note:  This document was prepared using Dragon voice recognition software and may include unintentional dictation errors.    Lavonia Drafts, MD 09/12/16 0830

## 2016-09-12 NOTE — Plan of Care (Signed)
Problem: Pain Managment: Goal: General experience of comfort will improve Outcome: Progressing Denies pain.  No distress noted.

## 2016-09-12 NOTE — ED Triage Notes (Signed)
Pt bib [facility] d/t fall from standing position while walking to bathroom. Pt poor historian. Wife reported to EMS AMS is baseline. VS WNL in route, CBG 128.

## 2016-09-12 NOTE — Care Management (Addendum)
Message left for Dr. Blane Ohara nurse Velna Hatchet regarding home health status. RNCM will continue to follow. Per Velna Hatchet patient is open to [facility]. I have updated Tim with [facility] of patient admission.

## 2016-09-12 NOTE — Progress Notes (Signed)
Pharmacy Antibiotic Note  Steven Medina is a 83 y.o. male admitted on 09/12/2016 with pneumonia.  Pharmacy has been consulted for azithromycin and ceftriaxone dosing. Patient had code sepsis initiated at 0850. Initial dose of azithromycin completed and ceftriaxone currently being infused.   Plan: Azithromycin 500mg  IV Q24hr with next dose at 1000 on 7/11.    Ceftriaxone 1000mg  IV Q24hr with next dose at 1000 on 7/11.    Pharmacy will continue to monitor for narrowing and IV to PO opportunities.   Height: 5\' 7"  (170.2 cm) Weight: 175 lb (79.4 kg) IBW/kg (Calculated) : 66.1  Temp (24hrs), Avg:100.2 F (37.9 C), Min:100.2 F (37.9 C), Max:100.2 F (37.9 C)   Recent Labs Lab 09/12/16 0628  WBC 5.0  CREATININE 0.80  LATICACIDVEN 2.3*    Estimated Creatinine Clearance: 65.7 mL/min (by C-G formula based on SCr of 0.8 mg/dL).    No Known Allergies  Antimicrobials this admission: Azithromycin 7/10 >>  Ceftriaxone 7/10 >>   Dose adjustments this admission: N/A  Microbiology results: 7/10 BCx: pending  7/10 UCx: pending   Thank you for allowing pharmacy to be a part of this patient's care.  Simpson,Michael L 09/12/2016 8:53 AM

## 2016-09-12 NOTE — Progress Notes (Signed)
Dr Darvin Neighbours made aware of Lactic Acid results.  Orders to be entered.

## 2016-09-12 NOTE — Consult Note (Signed)
Name: Steven Medina MRN: [id number] DOB: 06-13-1927     CONSULTATION DATE: 09/12/2016  REFERRING MD :  [facility]  CHIEF COMPLAINT:  Respiratory distress STUDIES:  I have Independently reviewed images of  CXR   on 09/12/2016 Interpretation:Right lower lobe opacification   HISTORY OF PRESENT ILLNESS:   83 year old pleasant white male seen today for shortness breath cough and increased work of breathing Patient has not been feeling well for the last several days with increased cough Patient is a former smoker quit 40 years ago Patient has increased work of breathing using accessory muscles and has bilateral wheezing Patient is alert and awake following commands Family at bedside updated and notified Chest x-ray shows right sided opacification consistent with pneumonia Patient was exposed to wife who also has upper respiratory tract infection Patient states he is not feeling well and has cough and breathing has gotten worse Patient was prescribed benzoate cough suppressants but has not been working   PAST MEDICAL HISTORY :  History of A. Fib sees Dr. Dionne Bucy as outpatient  Prior to Admission medications   Medication Sig Start Date End Date Taking? Authorizing Provider  amLODipine (NORVASC) 5 MG tablet Take 5 mg by mouth daily.   Yes [provider]  benzonatate (TESSALON) 100 MG capsule Take 100 capsules by mouth 3 (three) times daily. 09/11/16 09/21/16 Yes [provider]  latanoprost (XALATAN) 0.005 % ophthalmic solution 1 drop at bedtime.   Yes [provider]  lisinopril (PRINIVIL,ZESTRIL) 5 MG tablet Take 5 mg by mouth daily.   Yes [provider]  metoprolol tartrate (LOPRESSOR) 25 MG tablet Take 25 mg by mouth 2 (two) times daily.   Yes [provider]  Omega-3 Fatty Acids (FISH OIL PO) Take 1 capsule by mouth daily.   Yes [provider]  potassium chloride (MICRO-K) 10 MEQ CR capsule Take 10 mEq by mouth 2 (two) times daily.    Yes [provider]  rivaroxaban (XARELTO) 20 MG TABS tablet Take 20 mg by mouth daily with supper.   Yes [provider]  timolol (TIMOPTIC) 0.5 % ophthalmic solution 1 drop 2 (two) times daily.   Yes [provider]   No Known Allergies  FAMILY HISTORY:  Positive for hypertension  SOCIAL HISTORY: Previous tobacco abuser  quit 40 years ago  REVIEW OF SYSTEMS:   Constitutional: + malaise/fatigue and + diaphoresis.  HENT: Negative for hearing loss, ear pain, nosebleeds, congestion, sore throat, neck pain, tinnitus and ear discharge.   Eyes: Negative for blurred vision, double vision, photophobia, pain, discharge and redness.  Respiratory: + cough, -hemoptysis, + sputum production, + shortness of breath, -wheezing   Cardiovascular: Negative for chest pain, palpitations, orthopnea, claudication, leg swelling and PND.  Gastrointestinal: Negative for heartburn, nausea, vomiting, abdominal pain, diarrhea, constipation, blood in stool and melena.  Genitourinary: Negative for dysuria, urgency, frequency, hematuria and flank pain.  Musculoskeletal: Negative for myalgias, back pain, joint pain and falls.  Skin: Negative for itching and rash.  Neurological: Negative for dizziness, tingling, tremors, sensory change, speech change, focal weakness, seizures, loss of consciousness, weakness and headaches.  Endo/Heme/Allergies: Negative for environmental allergies and polydipsia. Does not bruise/bleed easily.  ALL OTHER ROS ARE NEGATIVE    VITAL SIGNS: Temp:  [100.2 F (37.9 C)] 100.2 F (37.9 C) (07/10 7169) Pulse Rate:  [93-109] 101 (07/10 0830) Resp:  [24-33] 24 (07/10 0830) BP: (122-155)/(81-95) 139/86 (07/10 0830) SpO2:  [94 %-98 %] 95 % (07/10 0830) Weight:  [678  lb (79.4 kg)] 175 lb (79.4 kg) (07/10 4403)  Physical Examination:   GENERAL: Moderate respiratory distress with increased work of breathing  HEAD: Normocephalic, atraumatic.  EYES: Pupils  equal, round, reactive to light. Extraocular muscles intact. No scleral icterus.  MOUTH: Moist mucosal membrane.   EAR, NOSE, THROAT: Clear without exudates. No external lesions.  NECK: Supple. No thyromegaly. No nodules. No JVD.  PULMONARY:CTA B/L + wheezes, CARDIOVASCULAR: S1 and S2. Regular rate and rhythm. No murmurs, rubs, or gallops. No edema.  GASTROINTESTINAL: Soft, nontender, nondistended. No masses. Positive bowel sounds.  MUSCULOSKELETAL: No swelling, clubbing, or edema. Range of motion full in all extremities.  NEUROLOGIC: Cranial nerves II through XII are intact. No gross focal neurological deficits.  SKIN: No ulceration, lesions, rashes, or cyanosis. Skin warm and dry. Turgor intact.  PSYCHIATRIC: Mood, affect within normal limits. The patient is awake, alert and oriented x 3. Insight, judgment intact.       Recent Labs Lab 09/12/16 0628  NA 135  K 4.2  CL 100*  CO2 25  BUN 17  CREATININE 0.80  GLUCOSE 123*    Recent Labs Lab 09/12/16 0628  HGB 16.2  HCT 48.5  WBC 5.0  PLT 124*   Dg Chest 2 View  Result Date: 09/12/2016 CLINICAL DATA:  Fall, pneumonia, possible sepsis EXAM: CHEST  2 VIEW COMPARISON:  07/28/2009 chest radiograph. FINDINGS: Stable cardiomediastinal silhouette with top-normal heart size and aortic atherosclerosis. No pneumothorax. No pleural effusion. Patchy consolidation at the right lung base. No pulmonary edema. Healed deformities in the lateral right ribs. Deformity in the right scapular body appears healed. IMPRESSION: Patchy right lung base consolidation compatible with pneumonia. Follow-up chest imaging to resolution recommended. Electronically Signed   By: Ilona Sorrel M.D.   On: 09/12/2016 07:34    ASSESSMENT / PLAN: 83 year old white male with progressive respiratory distress from right lower lobe pneumonia in the setting of probable COPD exacerbation in the setting of sepsis  #1 admit to ICU for close observation and management #2  aggressive bronchodilator therapy #3 start IV steroids #4 start IV antibiotics   Patient/Family are satisfied with Plan of action and management. All questions answered  Corrin Parker, M.D.  [facility]  Medical Director [facility] Director [facility] Cardio-Pulmonary Department

## 2016-09-12 NOTE — H&P (Signed)
[place] [facility] [place] NAME: Steven Medina    MR#:  [id number]  DATE OF BIRTH:  Dec 19, 1927  DATE OF ADMISSION:  09/12/2016  PRIMARY CARE PHYSICIAN: Leonel Ramsay, MD   REQUESTING/REFERRING PHYSICIAN: Lavonia Drafts, MD  CHIEF COMPLAINT:   Chief Complaint  Patient presents with  . Fall   HISTORY OF PRESENT ILLNESS:  Steven Medina  is a 83 y.o. male with a known history of Atrial fibrillation, coronary artery disease, hypertension is being admitted for possible sepsis due to pneumonia.  He also had rapid atrial fibrillation. Wife reports that patient has dementia and may have had a mini stroke about 6 months ago which has left him with some difficulty with balance. He has gone through physical therapy and rehabilitation and currently receives PT and OT at home. He has been doing relatively well. 3 days ago he developed a cough and decreased by mouth intake. Earlier this morning he became unsteady on his feet and she was able to help him to the floor. He did not fall or injure himself. He is on blood thinners for atrial fibrillation. PAST MEDICAL HISTORY:   Past Medical History:  Diagnosis Date  . A-fib ([place])   . Cancer ([place])   . Coronary artery disease   . Hyperlipemia   . Hypertension   . Skin cancer   . Stroke [facility])    PAST SURGICAL HISTORY:   Past Surgical History:  Procedure Laterality Date  . FRACTURE SURGERY    Right ankle surgery  SOCIAL HISTORY:   Social History  Substance Use Topics  . Smoking status: Former Research scientist (life sciences)  . Smokeless tobacco: Former Systems developer  . Alcohol use 0.6 oz/week    1 Glasses of wine per week    FAMILY HISTORY:  . Stroke Mother  . Stroke Father  DRUG ALLERGIES:  No Known Allergies  REVIEW OF SYSTEMS:   Review of Systems  Constitutional: Positive for malaise/fatigue. Negative for chills, fever and weight loss.  HENT: Negative for nosebleeds and sore throat.   Eyes: Negative for blurred  vision.  Respiratory: Positive for cough and shortness of breath. Negative for wheezing.   Cardiovascular: Negative for chest pain, orthopnea, leg swelling and PND.  Gastrointestinal: Negative for abdominal pain, constipation, diarrhea, heartburn, nausea and vomiting.  Genitourinary: Negative for dysuria and urgency.  Musculoskeletal: Positive for falls. Negative for back pain.  Skin: Negative for rash.  Neurological: Positive for weakness. Negative for dizziness, speech change, focal weakness and headaches.  Endo/Heme/Allergies: Does not bruise/bleed easily.  Psychiatric/Behavioral: Negative for depression.   MEDICATIONS AT HOME:   Prior to Admission medications   Medication Sig Start Date End Date Taking? Authorizing Provider  amLODipine (NORVASC) 5 MG tablet Take 5 mg by mouth daily.   Yes [provider]  benzonatate (TESSALON) 100 MG capsule Take 100 capsules by mouth 3 (three) times daily. 09/11/16 09/21/16 Yes [provider]  latanoprost (XALATAN) 0.005 % ophthalmic solution 1 drop at bedtime.   Yes [provider]  lisinopril (PRINIVIL,ZESTRIL) 5 MG tablet Take 5 mg by mouth daily.   Yes [provider]  metoprolol tartrate (LOPRESSOR) 25 MG tablet Take 25 mg by mouth 2 (two) times daily.   Yes [provider]  Omega-3 Fatty Acids (FISH OIL PO) Take 1 capsule by mouth daily.   Yes [provider]  potassium chloride (MICRO-K) 10 MEQ CR capsule Take 10 mEq by mouth 2 (two) times daily.  Yes [provider]  rivaroxaban (XARELTO) 20 MG TABS tablet Take 20 mg by mouth daily with supper.   Yes [provider]  timolol (TIMOPTIC) 0.5 % ophthalmic solution 1 drop 2 (two) times daily.   Yes [provider]   VITAL SIGNS:  Blood pressure 133/82, pulse (!) 110, temperature 98.2 F (36.8 C), temperature source Oral, resp. rate (!) 28, height 5\' 7"  (1.702 m), weight 79.4 kg (175 lb), SpO2 91 %. PHYSICAL  EXAMINATION:  Physical Exam  GENERAL:  83 y.o.-year-old patient lying in the bed with no acute distress.  EYES: Pupils equal, round, reactive to light and accommodation. No scleral icterus. Extraocular muscles intact.  HEENT: Head atraumatic, normocephalic. Oropharynx and nasopharynx clear.  NECK:  Supple, no jugular venous distention. No thyroid enlargement, no tenderness.  LUNGS: Decreased breath sounds bilaterally, mild expiratory wheezing, no rales,rhonchi or crepitation. No use of accessory muscles of respiration.  Tachypneic CARDIOVASCULAR: S1, S2 normal. No murmurs, rubs, or gallops.  Tachycardic ABDOMEN: Soft, nontender, nondistended. Bowel sounds present. No organomegaly or mass.  EXTREMITIES: No pedal edema, cyanosis, or clubbing.  NEUROLOGIC: Cranial nerves II through XII are intact. Muscle strength 5/5 in all extremities. Sensation intact. Gait not checked.  PSYCHIATRIC: The patient is alert and oriented x 3.  SKIN: No obvious rash, lesion, or ulcer.  LABORATORY PANEL:   CBC  Recent Labs Lab 09/12/16 1246  WBC 4.4  HGB 15.9  HCT 46.2  PLT 119*   ------------------------------------------------------------------------------------------------------------------  Chemistries   Recent Labs Lab 09/12/16 1246  NA 133*  K 4.1  CL 99*  CO2 22  GLUCOSE 127*  BUN 16  CREATININE 0.89  CALCIUM 8.9  AST 23  ALT 19  ALKPHOS 53  BILITOT 1.1   ------------------------------------------------------------------------------------------------------------------  Cardiac Enzymes No results for input(s): TROPONINI in the last 168 hours. ------------------------------------------------------------------------------------------------------------------  RADIOLOGY:  Dg Chest 2 View  Result Date: 09/12/2016 CLINICAL DATA:  Fall, pneumonia, possible sepsis EXAM: CHEST  2 VIEW COMPARISON:  07/28/2009 chest radiograph. FINDINGS: Stable cardiomediastinal silhouette with top-normal  heart size and aortic atherosclerosis. No pneumothorax. No pleural effusion. Patchy consolidation at the right lung base. No pulmonary edema. Healed deformities in the lateral right ribs. Deformity in the right scapular body appears healed. IMPRESSION: Patchy right lung base consolidation compatible with pneumonia. Follow-up chest imaging to resolution recommended. Electronically Signed   By: Ilona Sorrel M.D.   On: 09/12/2016 07:34   IMPRESSION AND PLAN:  83 year old male with a known history of Atrial fibrillation, coronary artery disease, hypertension is being admitted for possible sepsis due to pneumonia  * Sepsis: Present on admission - Likely source of infection - lung, Pneumonia - We will obtain blood culture, urine and sputum culture - ID consult - Continue IV Rocephin and Zithromax - Aggressive IV fluid resuscitation per sepsis protocol  * Community acquired pneumonia - Chest x-ray showing patchy, right lung base consolidation - Continue antibiotics as above   * COPD exacerbation - Likely occupational exposure, 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 - Obtain cardiology consultation    She is critically sick and high risk for cardio-respi. Arrest, multiorgan failure and death   All the records are reviewed and case discussed with ED provider. Management plans discussed with the patient, family (wife, daughter at bedside) and they are in agreement.  CODE STATUS:  DO NOT RESUSCITATE  TOTAL TIME (critical care) TAKING CARE OF THIS PATIENT: 45 minutes.    Max Sane M.D on 09/12/2016 at 3:50 PM  Between 7am to 6pm - Pager - [phone]  After 6pm go to [email or web address] - Proofreader  [facility]  Office  [phone]  CC: Primary care physician; Leonel Ramsay,  MD   Note: This dictation was prepared with Dragon dictation along with smaller phrase technology. Any transcriptional errors that result from this process are unintentional.

## 2016-09-12 NOTE — Consult Note (Signed)
[facility] Infectious Disease     Reason for Consult:PNA   Referring Physician: Mortimer Fries, D Date of Admission:  09/12/2016   Active Problems:   Sepsis [facility])   HPI: Steven Medina is a 83 y.o. male admitted with weakness and fall. Has had cough non productive for a week. Was started on tesssalon perles as otpt. On admit cxr rll infiltrate, low grade temp, wbc 5. Admitted to ICU.   Past Medical History:  Diagnosis Date  . A-fib ([place])   . Cancer ([place])   . Coronary artery disease   . Hyperlipemia   . Hypertension   . Skin cancer   . Stroke [facility])    Past Surgical History:  Procedure Laterality Date  . FRACTURE SURGERY     Social History  Substance Use Topics  . Smoking status: Former Research scientist (life sciences)  . Smokeless tobacco: Former Systems developer  . Alcohol use 0.6 oz/week    1 Glasses of wine per week   No family history on file.  Allergies: No Known Allergies  Current antibiotics: Antibiotics Given (last 72 hours)    Date/Time Action Medication Dose Rate   09/12/16 0749 New Bag/Given   azithromycin (ZITHROMAX) 500 mg in dextrose 5 % 250 mL IVPB 500 mg 250 mL/hr   09/12/16 0849 New Bag/Given   cefTRIAXone (ROCEPHIN) 1 g in dextrose 5 % 50 mL IVPB 1 g 100 mL/hr      MEDICATIONS: . aspirin  324 mg Oral NOW   Or  . aspirin  300 mg Rectal NOW  . budesonide (PULMICORT) nebulizer solution  0.5 mg Nebulization BID  . enoxaparin (LOVENOX) injection  40 mg Subcutaneous Q24H  . ipratropium-albuterol  3 mL Nebulization Q4H  . methylPREDNISolone (SOLU-MEDROL) injection  40 mg Intravenous Q12H  . pantoprazole  40 mg Oral Daily    Review of Systems - unable to obtain  OBJECTIVE: Temp:  [98.2 F (36.8 C)-100.2 F (37.9 C)] 98.2 F (36.8 C) (07/10 1210) Pulse Rate:  [93-121] 110 (07/10 1210) Resp:  [22-33] 28 (07/10 1210) BP: (116-155)/(75-95) 133/82 (07/10 1210) SpO2:  [91 %-98 %] 91 % (07/10 1210) Weight:  [79.4 kg (175 lb)] 79.4 kg (175 lb) (07/10 7902) Physical Exam  Constitutional:  He is faril, somewhat confused HENT: anicteric Mouth/Throat: Oropharynx is clear and moist. No oropharyngeal exudate.  Cardiovascular: Tachy,   Pulmonary/Chest: poor air movement Abdominal: Soft. Bowel sounds are normal. He exhibits no distension. There is no tenderness.  Lymphadenopathy:  He has no cervical adenopathy.  Neurological: He is awake but somewhat confused Skin: Skin is warm and dry. No rash noted. No erythema.  Psychiatric: He has a normal mood and affect. His behavior is normal.     LABS: Results for orders placed or performed during the hospital encounter of 09/12/16 (from the past 48 hour(s))  Comprehensive metabolic panel     Status: Abnormal   Collection Time: 09/12/16  6:28 AM  Result Value Ref Range   Sodium 135 135 - 145 mmol/L   Potassium 4.2 3.5 - 5.1 mmol/L   Chloride 100 (L) 101 - 111 mmol/L   CO2 25 22 - 32 mmol/L   Glucose, Bld 123 (H) 65 - 99 mg/dL   BUN 17 6 - 20 mg/dL   Creatinine, Ser 0.80 0.61 - 1.24 mg/dL   Calcium 9.1 8.9 - 10.3 mg/dL   Total Protein 8.2 (H) 6.5 - 8.1 g/dL   Albumin 4.2 3.5 - 5.0 g/dL   AST 26 15 - 41 U/L  ALT 20 17 - 63 U/L   Alkaline Phosphatase 63 38 - 126 U/L   Total Bilirubin 1.5 (H) 0.3 - 1.2 mg/dL   GFR calc non Af Amer >60 >60 mL/min   GFR calc Af Amer >60 >60 mL/min    Comment: (NOTE) The eGFR has been calculated using the CKD EPI equation. This calculation has not been validated in all clinical situations. eGFR's persistently <60 mL/min signify possible Chronic Kidney Disease.    Anion gap 10 5 - 15  Lactic acid, plasma     Status: Abnormal   Collection Time: 09/12/16  6:28 AM  Result Value Ref Range   Lactic Acid, Venous 2.3 (HH) 0.5 - 1.9 mmol/L    Comment: CRITICAL RESULT CALLED TO, READ BACK BY AND VERIFIED WITH  ALLISON PATE AT 0710 09/12/16 SDR   CBC with Differential     Status: Abnormal   Collection Time: 09/12/16  6:28 AM  Result Value Ref Range   WBC 5.0 3.8 - 10.6 K/uL   RBC 5.41 4.40 - 5.90  MIL/uL   Hemoglobin 16.2 13.0 - 18.0 g/dL   HCT 48.5 40.0 - 52.0 %   MCV 89.6 80.0 - 100.0 fL   MCH 30.0 26.0 - 34.0 pg   MCHC 33.5 32.0 - 36.0 g/dL   RDW 15.6 (H) 11.5 - 14.5 %   Platelets 124 (L) 150 - 440 K/uL   Neutrophils Relative % 83 %   Neutro Abs 4.2 1.4 - 6.5 K/uL   Lymphocytes Relative 8 %   Lymphs Abs 0.4 (L) 1.0 - 3.6 K/uL   Monocytes Relative 8 %   Monocytes Absolute 0.4 0.2 - 1.0 K/uL   Eosinophils Relative 0 %   Eosinophils Absolute 0.0 0 - 0.7 K/uL   Basophils Relative 1 %   Basophils Absolute 0.0 0 - 0.1 K/uL  Protime-INR     Status: Abnormal   Collection Time: 09/12/16  6:28 AM  Result Value Ref Range   Prothrombin Time 22.5 (H) 11.4 - 15.2 seconds   INR 1.95   Procalcitonin - Baseline     Status: None   Collection Time: 09/12/16  6:28 AM  Result Value Ref Range   Procalcitonin <0.10 ng/mL    Comment:        Interpretation: PCT (Procalcitonin) <= 0.5 ng/mL: Systemic infection (sepsis) is not likely. Local bacterial infection is possible. (NOTE)         ICU PCT Algorithm               Non ICU PCT Algorithm    ----------------------------     ------------------------------         PCT < 0.25 ng/mL                 PCT < 0.1 ng/mL     Stopping of antibiotics            Stopping of antibiotics       strongly encouraged.               strongly encouraged.    ----------------------------     ------------------------------       PCT level decrease by               PCT < 0.25 ng/mL       >= 80% from peak PCT       OR PCT 0.25 - 0.5 ng/mL          Stopping of antibiotics  encouraged.     Stopping of antibiotics           encouraged.    ----------------------------     ------------------------------       PCT level decrease by              PCT >= 0.25 ng/mL       < 80% from peak PCT        AND PCT >= 0.5 ng/mL            Continuin g antibiotics                                              encouraged.        Continuing antibiotics            encouraged.    ----------------------------     ------------------------------     PCT level increase compared          PCT > 0.5 ng/mL         with peak PCT AND          PCT >= 0.5 ng/mL             Escalation of antibiotics                                          strongly encouraged.      Escalation of antibiotics        strongly encouraged.   Urinalysis, Routine w reflex microscopic     Status: Abnormal   Collection Time: 09/12/16  7:17 AM  Result Value Ref Range   Color, Urine YELLOW (A) YELLOW   APPearance CLEAR (A) CLEAR   Specific Gravity, Urine 1.016 1.005 - 1.030   pH 5.0 5.0 - 8.0   Glucose, UA NEGATIVE NEGATIVE mg/dL   Hgb urine dipstick SMALL (A) NEGATIVE   Bilirubin Urine NEGATIVE NEGATIVE   Ketones, ur 5 (A) NEGATIVE mg/dL   Protein, ur 30 (A) NEGATIVE mg/dL   Nitrite NEGATIVE NEGATIVE   Leukocytes, UA NEGATIVE NEGATIVE   RBC / HPF 0-5 0 - 5 RBC/hpf   WBC, UA 0-5 0 - 5 WBC/hpf   Bacteria, UA NONE SEEN NONE SEEN   Squamous Epithelial / LPF 0-5 (A) NONE SEEN   Mucous PRESENT   Lactic acid, plasma     Status: Abnormal   Collection Time: 09/12/16  9:31 AM  Result Value Ref Range   Lactic Acid, Venous 2.2 (HH) 0.5 - 1.9 mmol/L    Comment: CRITICAL RESULT CALLED TO, READ BACK BY AND VERIFIED WITH  STEPHANIE RUDD AT 1025 09/12/16 SDR   Glucose, capillary     Status: Abnormal   Collection Time: 09/12/16 12:08 PM  Result Value Ref Range   Glucose-Capillary 126 (H) 65 - 99 mg/dL  MRSA PCR Screening     Status: None   Collection Time: 09/12/16 12:31 PM  Result Value Ref Range   MRSA by PCR NEGATIVE NEGATIVE    Comment:        The GeneXpert MRSA Assay (FDA approved for NASAL specimens only), is one component of a comprehensive MRSA colonization surveillance program. It is not intended to diagnose MRSA infection nor to guide or monitor treatment for MRSA infections.   CBC  with Differential     Status: Abnormal   Collection  Time: 09/12/16 12:46 PM  Result Value Ref Range   WBC 4.4 3.8 - 10.6 K/uL   RBC 5.21 4.40 - 5.90 MIL/uL   Hemoglobin 15.9 13.0 - 18.0 g/dL   HCT 46.2 40.0 - 52.0 %   MCV 88.7 80.0 - 100.0 fL   MCH 30.5 26.0 - 34.0 pg   MCHC 34.3 32.0 - 36.0 g/dL   RDW 15.7 (H) 11.5 - 14.5 %   Platelets 119 (L) 150 - 440 K/uL   Neutrophils Relative % 91 %   Neutro Abs 4.0 1.4 - 6.5 K/uL   Lymphocytes Relative 6 %   Lymphs Abs 0.3 (L) 1.0 - 3.6 K/uL   Monocytes Relative 3 %   Monocytes Absolute 0.1 (L) 0.2 - 1.0 K/uL   Eosinophils Relative 0 %   Eosinophils Absolute 0.0 0 - 0.7 K/uL   Basophils Relative 0 %   Basophils Absolute 0.0 0 - 0.1 K/uL  Comprehensive metabolic panel     Status: Abnormal   Collection Time: 09/12/16 12:46 PM  Result Value Ref Range   Sodium 133 (L) 135 - 145 mmol/L   Potassium 4.1 3.5 - 5.1 mmol/L   Chloride 99 (L) 101 - 111 mmol/L   CO2 22 22 - 32 mmol/L   Glucose, Bld 127 (H) 65 - 99 mg/dL   BUN 16 6 - 20 mg/dL   Creatinine, Ser 0.89 0.61 - 1.24 mg/dL   Calcium 8.9 8.9 - 10.3 mg/dL   Total Protein 7.5 6.5 - 8.1 g/dL   Albumin 3.9 3.5 - 5.0 g/dL   AST 23 15 - 41 U/L   ALT 19 17 - 63 U/L   Alkaline Phosphatase 53 38 - 126 U/L   Total Bilirubin 1.1 0.3 - 1.2 mg/dL   GFR calc non Af Amer >60 >60 mL/min   GFR calc Af Amer >60 >60 mL/min    Comment: (NOTE) The eGFR has been calculated using the CKD EPI equation. This calculation has not been validated in all clinical situations. eGFR's persistently <60 mL/min signify possible Chronic Kidney Disease.    Anion gap 12 5 - 15  Lactic acid, plasma     Status: Abnormal   Collection Time: 09/12/16 12:46 PM  Result Value Ref Range   Lactic Acid, Venous 2.3 (HH) 0.5 - 1.9 mmol/L    Comment: CRITICAL RESULT CALLED TO, READ BACK BY AND VERIFIED WITH BRAD CHRISMON AT 1327 ON 09/12/16 [place].   Procalcitonin     Status: None   Collection Time: 09/12/16 12:46 PM  Result Value Ref Range   Procalcitonin <0.10 ng/mL    Comment:         Interpretation: PCT (Procalcitonin) <= 0.5 ng/mL: Systemic infection (sepsis) is not likely. Local bacterial infection is possible. (NOTE)         ICU PCT Algorithm               Non ICU PCT Algorithm    ----------------------------     ------------------------------         PCT < 0.25 ng/mL                 PCT < 0.1 ng/mL     Stopping of antibiotics            Stopping of antibiotics       strongly encouraged.  strongly encouraged.    ----------------------------     ------------------------------       PCT level decrease by               PCT < 0.25 ng/mL       >= 80% from peak PCT       OR PCT 0.25 - 0.5 ng/mL          Stopping of antibiotics                                             encouraged.     Stopping of antibiotics           encouraged.    ----------------------------     ------------------------------       PCT level decrease by              PCT >= 0.25 ng/mL       < 80% from peak PCT        AND PCT >= 0.5 ng/mL            Continuin g antibiotics                                              encouraged.       Continuing antibiotics            encouraged.    ----------------------------     ------------------------------     PCT level increase compared          PCT > 0.5 ng/mL         with peak PCT AND          PCT >= 0.5 ng/mL             Escalation of antibiotics                                          strongly encouraged.      Escalation of antibiotics        strongly encouraged.   Protime-INR     Status: Abnormal   Collection Time: 09/12/16 12:46 PM  Result Value Ref Range   Prothrombin Time 19.9 (H) 11.4 - 15.2 seconds   INR 1.67   APTT     Status: None   Collection Time: 09/12/16 12:46 PM  Result Value Ref Range   aPTT 36 24 - 36 seconds   No components found for: ESR, C REACTIVE PROTEIN MICRO: Recent Results (from the past 720 hour(s))  MRSA PCR Screening     Status: None   Collection Time: 09/12/16 12:31 PM  Result Value Ref  Range Status   MRSA by PCR NEGATIVE NEGATIVE Final    Comment:        The GeneXpert MRSA Assay (FDA approved for NASAL specimens only), is one component of a comprehensive MRSA colonization surveillance program. It is not intended to diagnose MRSA infection nor to guide or monitor treatment for MRSA infections.     IMAGING: Dg Chest 2 View  Result Date: 09/12/2016 CLINICAL DATA:  Fall, pneumonia, possible sepsis EXAM: CHEST  2 VIEW COMPARISON:  07/28/2009 chest radiograph. FINDINGS: Stable  cardiomediastinal silhouette with top-normal heart size and aortic atherosclerosis. No pneumothorax. No pleural effusion. Patchy consolidation at the right lung base. No pulmonary edema. Healed deformities in the lateral right ribs. Deformity in the right scapular body appears healed. IMPRESSION: Patchy right lung base consolidation compatible with pneumonia. Follow-up chest imaging to resolution recommended. Electronically Signed   By: Ilona Sorrel M.D.   On: 09/12/2016 07:34    Assessment:   Romond Pipkins is a 83 y.o. male admitted with RLL pna following a cough for 1 week. Family denies any aspiration events but some confusion.   Recommendations Continue ceftriaxone and azithro and follow clinically. If worsens would start anaerobic coverage Thank you very much for allowing me to participate in the care of this patient. Please call with questions.   Cheral Marker. Ola Spurr, MD

## 2016-09-12 NOTE — ED Notes (Signed)
Wife states pt increasingly weak x 2 days. Attempted ambulation to bathroom this morning, stumbled so wife assisted to floor (wife states was not a fall). Wife denies LOC. Wife reports recent illness-URI type symptoms

## 2016-09-13 ENCOUNTER — Inpatient Hospital Stay
Admit: 2016-09-13 | Discharge: 2016-09-13 | Disposition: A | Discharge status: Home / Self Care | Payer: Medicare Other | Attending: Internal Medicine | Admitting: Internal Medicine

## 2016-09-13 ENCOUNTER — Inpatient Hospital Stay: Payer: Medicare Other

## 2016-09-13 DIAGNOSIS — J441 Chronic obstructive pulmonary disease with (acute) exacerbation: Secondary | ICD-10-CM

## 2016-09-13 DIAGNOSIS — J9601 Acute respiratory failure with hypoxia: Secondary | ICD-10-CM

## 2016-09-13 LAB — LACTIC ACID, PLASMA
LACTIC ACID, VENOUS: 4.5 mmol/L — AB (ref 0.5–1.9)
Lactic Acid, Venous: 4.1 mmol/L (ref 0.5–1.9)
Lactic Acid, Venous: 4.2 mmol/L (ref 0.5–1.9)

## 2016-09-13 LAB — BASIC METABOLIC PANEL
Anion gap: 12 (ref 5–15)
BUN: 13 mg/dL (ref 6–20)
CO2: 22 mmol/L (ref 22–32)
CREATININE: 0.97 mg/dL (ref 0.61–1.24)
Calcium: 8.6 mg/dL — ABNORMAL LOW (ref 8.9–10.3)
Chloride: 104 mmol/L (ref 101–111)
Glucose, Bld: 167 mg/dL — ABNORMAL HIGH (ref 65–99)
Potassium: 3.9 mmol/L (ref 3.5–5.1)
SODIUM: 138 mmol/L (ref 135–145)

## 2016-09-13 LAB — CBC
HCT: 46.9 % (ref 40.0–52.0)
Hemoglobin: 16 g/dL (ref 13.0–18.0)
MCH: 30.5 pg (ref 26.0–34.0)
MCHC: 34.1 g/dL (ref 32.0–36.0)
MCV: 89.4 fL (ref 80.0–100.0)
PLATELETS: 133 10*3/uL — AB (ref 150–440)
RBC: 5.25 MIL/uL (ref 4.40–5.90)
RDW: 15.8 % — ABNORMAL HIGH (ref 11.5–14.5)
WBC: 9.1 10*3/uL (ref 3.8–10.6)

## 2016-09-13 LAB — PROCALCITONIN: Procalcitonin: <0.1 ng/mL

## 2016-09-13 LAB — URINE CULTURE

## 2016-09-13 LAB — GLUCOSE, CAPILLARY: GLUCOSE-CAPILLARY: 156 mg/dL — AB (ref 65–99)

## 2016-09-13 MED ORDER — FUROSEMIDE 10 MG/ML IJ SOLN
40.0000 mg | Freq: Once | INTRAMUSCULAR | Status: AC
  Administered 2016-09-13: 40 mg via INTRAVENOUS

## 2016-09-13 MED ORDER — IPRATROPIUM-ALBUTEROL 0.5-2.5 (3) MG/3ML IN SOLN
3.0000 mL | Freq: Four times a day (QID) | RESPIRATORY_TRACT | Status: DC
  Administered 2016-09-13: 3 mL via RESPIRATORY_TRACT
  Filled 2016-09-13 (×2): qty 3

## 2016-09-13 MED ORDER — IPRATROPIUM-ALBUTEROL 0.5-2.5 (3) MG/3ML IN SOLN
3.0000 mL | RESPIRATORY_TRACT | Status: DC
  Administered 2016-09-13 – 2016-09-15 (×12): 3 mL via RESPIRATORY_TRACT
  Filled 2016-09-13 (×11): qty 3

## 2016-09-13 MED ORDER — FUROSEMIDE 10 MG/ML IJ SOLN
INTRAMUSCULAR | Status: AC
  Administered 2016-09-13: 40 mg via INTRAVENOUS
  Filled 2016-09-13: qty 4

## 2016-09-13 MED ORDER — DEXTROSE 5 % IV SOLN
1.0000 g | INTRAVENOUS | Status: DC
  Administered 2016-09-13: 1 g via INTRAVENOUS
  Filled 2016-09-13 (×2): qty 10

## 2016-09-13 MED ORDER — PIPERACILLIN-TAZOBACTAM 3.375 G IVPB
3.3750 g | Freq: Three times a day (TID) | INTRAVENOUS | Status: DC
  Administered 2016-09-13 – 2016-09-15 (×6): 3.375 g via INTRAVENOUS
  Filled 2016-09-13 (×6): qty 50

## 2016-09-13 MED ORDER — SODIUM CHLORIDE 0.9 % IV BOLUS (SEPSIS)
250.0000 mL | Freq: Once | INTRAVENOUS | Status: AC
  Administered 2016-09-13: 250 mL via INTRAVENOUS

## 2016-09-13 MED ORDER — TRAZODONE HCL 50 MG PO TABS
50.0000 mg | ORAL_TABLET | Freq: Every day | ORAL | Status: DC
  Administered 2016-09-13 – 2016-09-14 (×3): 50 mg via ORAL
  Filled 2016-09-13 (×3): qty 1

## 2016-09-13 NOTE — Progress Notes (Signed)
Dr Manuella Ghazi made aware of respiratory status.  Orders given for Lasix now.

## 2016-09-13 NOTE — Progress Notes (Signed)
No charge note.  Will hold off on consult after conferring with Dr. Mortimer Fries.   Introduced myself to patient and family.  Offered support.  Committed to check in again tomorrow.  Florentina Jenny, PA-C Palliative Medicine Pager: [phone]

## 2016-09-13 NOTE — Progress Notes (Signed)
After further evaluation and discussion with family patient has significantly decompensated over the last 12 hours in the setting of progressive respiratory failure with intermittent A. fib with RVR now is on high flow nasal cannula.  Family at bedside has witnessed his respiratory demise After further discussion with the family, they have consented and agreed to DNR DNI status We will assess patient's respiratory status in the next 24-48 hours and assess for comfort if needed   Family are satisfied with Plan of action and management. All questions answered  Corrin [place], M.D.  [facility]  Medical Director [place] Director [facility] Cardio-Pulmonary Department

## 2016-09-13 NOTE — Progress Notes (Signed)
Dr Ara Kussmaul notified of AM level of Lactic Acid.

## 2016-09-13 NOTE — Care Management (Signed)
RNCM spoke with Tim with [facility]. They have referral but case is not open as of yet.

## 2016-09-13 NOTE — Progress Notes (Signed)
Verbal order received from Dr Ara Kussmaul to repeat Lactic acid.  No fluids ordered at this time.

## 2016-09-13 NOTE — Progress Notes (Signed)
*  PRELIMINARY RESULTS* Echocardiogram 2D Echocardiogram has been performed.  Steven Medina 09/13/2016, 3:23 PM

## 2016-09-13 NOTE — Progress Notes (Signed)
RN in to reassess vital signs.  Pt resting in bed, agitated.  Tachypneic at 34/min.  Audible gurgles noted. Respiratory therapy paged for nebulizer treatment.

## 2016-09-13 NOTE — Progress Notes (Signed)
Pt confused and agitated.  Pulling at IV sites.  Soft mittens applied without relief of tube tampering.  Afib >120 noted on telemetry.  Dr Estanislado Pandy notified.  Orders given.

## 2016-09-13 NOTE — Progress Notes (Signed)
Dr. Manuella Ghazi telephoned made aware of pt. Lactic acid 4.1. No new orders at this time.

## 2016-09-13 NOTE — Progress Notes (Signed)
Pharmacy Antibiotic Note  Ramez Arrona is a 83 y.o. male admitted on 09/12/2016 with pneumonia.  Pharmacy has been consulted for azithromycin and ceftriaxone dosing. Ceftriaxone d/c but then resumed due to respiratory distress necessitating transfer of patient back to the ICU.   PCT <  0.1 x 3  Plan: Continue azithromycin 500 mg iv q 24 hours.   Will resume ceftriaxone 1 g iv q 24 hours now and discuss possible discontinuation due to PCT < 0.1 x 3.   Pharmacy will continue to monitor for narrowing and IV to PO opportunities.   Height: 5\' 7"  (170.2 cm) Weight: 185 lb 8 oz (84.1 kg) IBW/kg (Calculated) : 66.1  Temp (24hrs), Avg:97.5 F (36.4 C), Min:96.6 F (35.9 C), Max:98.2 F (36.8 C)   Recent Labs Lab 09/12/16 0628  09/12/16 1246 09/12/16 1525 09/13/16 0352 09/13/16 0713 09/13/16 0951  WBC 5.0  --  4.4  --  9.1  --   --   CREATININE 0.80  --  0.89  --  0.97  --   --   LATICACIDVEN 2.3*  < > 2.3* 3.5* 4.5* 4.1* 4.2*  < > = values in this interval not displayed.  Estimated Creatinine Clearance: 55.6 mL/min (by C-G formula based on SCr of 0.97 mg/dL).    No Known Allergies     Antimicrobials this admission: Azithromycin 7/10 >>  Ceftriaxone 7/10 >>   Dose adjustments this admission: N/A  Microbiology results: 7/10 UCx: insignificant growth 7/10 BCx: NGTD 7/10 MRSA PCR: negative   Thank you for allowing pharmacy to be a part of this patient's care.  Ulice Dash D 09/13/2016 10:41 AM

## 2016-09-13 NOTE — Progress Notes (Signed)
Pt. Transferred to CCU bed 8. Spouse at bedside all pt. Belongings sent with spouse. Pt. Alert and oriented with confusion.

## 2016-09-13 NOTE — Progress Notes (Signed)
CH made a follow up visit. Pt and family were viewing a sonogram of Pt's heart. [place] provided the ministry of empathetic listening.     09/13/16 1500  Clinical Encounter Type  Visited With Patient;Family;Patient and family together  Visit Type Initial;Spiritual support;Critical Care  Referral From Family  Consult/Referral To Chaplain  Spiritual Encounters  Spiritual Needs Prayer;Emotional

## 2016-09-13 NOTE — Progress Notes (Signed)
Pharmacy Antibiotic Note  Steven Medina is a 83 y.o. male admitted on 09/12/2016 with pneumonia.  Pharmacy has been consulted for azithromycin and ceftriaxone dosing. Ceftriaxone d/c but then resumed due to respiratory distress necessitating transfer of patient back to the ICU. Patient now being initiated on Zosyn for possible aspiration PNA.   PCT <  0.1 x 3  Plan: Continue azithromycin 500 mg IV q 24 hours.   Will initiate Zosyn EI 3.375g IV Q8hr for possible aspiration PNA.   Pharmacy will continue to monitor for narrowing and IV to PO opportunities.   Height: 5\' 7"  (170.2 cm) Weight: 185 lb 8 oz (84.1 kg) IBW/kg (Calculated) : 66.1  Temp (24hrs), Avg:97.4 F (36.3 C), Min:96.6 F (35.9 C), Max:97.8 F (36.6 C)   Recent Labs Lab 09/12/16 0628  09/12/16 1246 09/12/16 1525 09/13/16 0352 09/13/16 0713 09/13/16 0951  WBC 5.0  --  4.4  --  9.1  --   --   CREATININE 0.80  --  0.89  --  0.97  --   --   LATICACIDVEN 2.3*  < > 2.3* 3.5* 4.5* 4.1* 4.2*  < > = values in this interval not displayed.  Estimated Creatinine Clearance: 55.6 mL/min (by C-G formula based on SCr of 0.97 mg/dL).    No Known Allergies    Antimicrobials this admission: Azithromycin 7/10 >>  Ceftriaxone 7/10 >> 7/11 Zosyn 7/11 >>  Dose adjustments this admission: N/A  Microbiology results: 7/10 UCx: insignificant growth 7/10 BCx: NGTD 7/10 MRSA PCR: negative   Thank you for allowing pharmacy to be a part of this patient's care.  Simpson,Michael L 09/13/2016 3:29 PM

## 2016-09-13 NOTE — Progress Notes (Signed)
[place] INFECTIOUS DISEASE PROGRESS NOTE Date of Admission:  09/12/2016     ID: Steven Medina is a 83 y.o. male with PNA Active Problems:   Sepsis ([place])   Subjective: Was transferred out of unit but seems to have become confused then developed progressive resp distress and back in ICU on hi flo. His family at bedside.  ROS  Unable to obtain  Medications:  Antibiotics Given (last 72 hours)    Date/Time Action Medication Dose Rate   09/12/16 0749 New Bag/Given   azithromycin (ZITHROMAX) 500 mg in dextrose 5 % 250 mL IVPB 500 mg 250 mL/hr   09/12/16 0849 New Bag/Given   cefTRIAXone (ROCEPHIN) 1 g in dextrose 5 % 50 mL IVPB 1 g 100 mL/hr   09/13/16 0927 New Bag/Given   cefTRIAXone (ROCEPHIN) 1 g in dextrose 5 % 50 mL IVPB 1 g 100 mL/hr   09/13/16 1107 New Bag/Given   azithromycin (ZITHROMAX) 500 mg in dextrose 5 % 250 mL IVPB 500 mg 250 mL/hr   09/13/16 1714 New Bag/Given   piperacillin-tazobactam (ZOSYN) IVPB 3.375 g 3.375 g 12.5 mL/hr     . amLODipine  5 mg Oral Daily  . budesonide (PULMICORT) nebulizer solution  0.5 mg Nebulization BID  . ipratropium-albuterol  3 mL Nebulization Q4H  . lisinopril  5 mg Oral Daily  . methylPREDNISolone (SOLU-MEDROL) injection  40 mg Intravenous Daily  . metoprolol tartrate  25 mg Oral BID  . pantoprazole  40 mg Oral Daily  . rivaroxaban  20 mg Oral Q supper  . traZODone  50 mg Oral QHS    Objective: Vital signs in last 24 hours: Temp:  [96.1 F (35.6 C)-97.8 F (36.6 C)] 96.1 F (35.6 C) (07/11 1700) Pulse Rate:  [91-150] 117 (07/11 1700) Resp:  [17-32] 22 (07/11 1700) BP: (104-195)/(57-160) 110/65 (07/11 1700) SpO2:  [91 %-98 %] 96 % (07/11 1700) FiO2 (%):  [40 %] 40 % (07/11 1641) Weight:  [81 kg (178 lb 9.2 oz)-84.1 kg (185 lb 8 oz)] 81 kg (178 lb 9.2 oz) (07/11 1100) Constitutional: He is faril, somewhat confused HENT: anicteric Mouth/Throat: Oropharynx is clear and moist. No oropharyngeal exudate.  Cardiovascular:  Tachy,   Pulmonary/Chest: poor air movement Abdominal: Soft. Bowel sounds are normal. He exhibits no distension. There is no tenderness.  Lymphadenopathy:  He has no cervical adenopathy.  Neurological: He is awake but somewhat confused Skin: Skin is warm and dry. No rash noted. No erythema.  Psychiatric: He has a normal mood and affect. His behavior is normal.   Lab Results  Recent Labs  09/12/16 1246 09/13/16 0352  WBC 4.4 9.1  HGB 15.9 16.0  HCT 46.2 46.9  NA 133* 138  K 4.1 3.9  CL 99* 104  CO2 22 22  BUN 16 13  CREATININE 0.89 0.97    Microbiology: Results for orders placed or performed during the hospital encounter of 09/12/16  Culture, blood (Routine x 2)     Status: None (Preliminary result)   Collection Time: 09/12/16  6:28 AM  Result Value Ref Range Status   Specimen Description BLOOD  LEFT FOREARM   Final   Special Requests   Final    BOTTLES DRAWN AEROBIC AND ANAEROBIC Blood Culture adequate volume   Culture NO GROWTH 1 DAY  Final   Report Status PENDING  Incomplete  Culture, blood (Routine x 2)     Status: None (Preliminary result)   Collection Time: 09/12/16  6:28 AM  Result Value  Ref Range Status   Specimen Description BLOOD  LEFT AC  Final   Special Requests   Final    BOTTLES DRAWN AEROBIC AND ANAEROBIC Blood Culture results may not be optimal due to an excessive volume of blood received in culture bottles   Culture NO GROWTH 1 DAY  Final   Report Status PENDING  Incomplete  Urine culture     Status: Abnormal   Collection Time: 09/12/16  7:17 AM  Result Value Ref Range Status   Specimen Description URINE, RANDOM  Final   Special Requests NONE  Final   Culture (A)  Final    <10,000 COLONIES/mL INSIGNIFICANT GROWTH Performed at [facility], [address], [place], [place] [address]    Report Status 09/13/2016 FINAL  Final  Culture, blood (x 2)     Status: None (Preliminary result)   Collection Time: 09/12/16 12:30 PM  Result Value Ref  Range Status   Specimen Description BLOOD RIGHT HAND  Final   Special Requests   Final    BOTTLES DRAWN AEROBIC AND ANAEROBIC Blood Culture adequate volume   Culture NO GROWTH < 24 HOURS  Final   Report Status PENDING  Incomplete  MRSA PCR Screening     Status: None   Collection Time: 09/12/16 12:31 PM  Result Value Ref Range Status   MRSA by PCR NEGATIVE NEGATIVE Final    Comment:        The GeneXpert MRSA Assay (FDA approved for NASAL specimens only), is one component of a comprehensive MRSA colonization surveillance program. It is not intended to diagnose MRSA infection nor to guide or monitor treatment for MRSA infections.   Culture, blood (x 2)     Status: None (Preliminary result)   Collection Time: 09/12/16 12:46 PM  Result Value Ref Range Status   Specimen Description BLOOD  LEFT HAND   Final   Special Requests   Final    BOTTLES DRAWN AEROBIC AND ANAEROBIC Blood Culture adequate volume   Culture NO GROWTH < 24 HOURS  Final   Report Status PENDING  Incomplete    Studies/Results: Dg Chest 2 View  Result Date: 09/12/2016 CLINICAL DATA:  Fall, pneumonia, possible sepsis EXAM: CHEST  2 VIEW COMPARISON:  07/28/2009 chest radiograph. FINDINGS: Stable cardiomediastinal silhouette with top-normal heart size and aortic atherosclerosis. No pneumothorax. No pleural effusion. Patchy consolidation at the right lung base. No pulmonary edema. Healed deformities in the lateral right ribs. Deformity in the right scapular body appears healed. IMPRESSION: Patchy right lung base consolidation compatible with pneumonia. Follow-up chest imaging to resolution recommended. Electronically Signed   By: Ilona Sorrel M.D.   On: 09/12/2016 07:34   Dg Chest Port 1 View  Result Date: 09/13/2016 CLINICAL DATA:  Shortness of breath. EXAM: PORTABLE CHEST 1 VIEW COMPARISON:  09/12/2016 and prior chest radiographs. FINDINGS: The cardiac silhouette is enlarged. Mediastinal contours appear intact. Calcific  atherosclerotic disease of the aorta. There is no evidence of pneumothorax. Patchy airspace consolidation with peripheral predominance are seen in bilateral lower lungs and right mid lung field. Biapical pleural thickening. Probably chronic fractures of several right-sided ribs with subsequent posttraumatic concave right-sided chest deformity. Soft tissues are grossly normal. IMPRESSION: Bilateral lower lower lobes and right middle lobe patchy airspace consolidation. This may represent multifocal pneumonia, possibly aspiration pneumonia. Please follow-up to resolution to exclude underlying pulmonary masses. Enlarged cardiac silhouette and calcific atherosclerotic disease of the aorta. Electronically Signed   By: Linwood Dibbles.D.  On: 09/13/2016 09:01    Assessment/Plan: Steven Medina is a 83 y.o. male admitted with RLL pna following a cough for 1 week. Family denies any aspiration events but some confusion.  He was improving but has now worsened and is on Hi Flow o2. Intermittent confusion. CXR worsening. I suspect aspiration.   Recommendations Change ceftriaxone to zosyn for aspiration coverage No need for vanco since MRSA PCR neg Cont azithro x 5 days follow clinically. He is DNR. Discussed with his wife and daughter. I am also his PCP.  Thank you very much for the consult. Will follow with you.  FITZGERALD, DAVID P   09/13/2016, 7:10 PM

## 2016-09-13 NOTE — Progress Notes (Signed)
Name: Steven Medina MRN: [id number] DOB: 08-Jun-1927    ADMISSION DATE:  09/12/2016 CONSULTATION DATE:  09/12/2016  REFERRING MD : Dr. Manuella Ghazi  CHIEF COMPLAINT:  Acute Respiratory Distress  BRIEF PATIENT DESCRIPTION:  83 y.o. Male admitted on 09/12/16 with Acute Respiratory Distress secondary to CAP and AECOPD, and Sepsis.  He was transferred out of ICU on 09/12/16  SIGNIFICANT EVENTS  09/12/16>> Admission to [facility] ICU, transfer to Telemetry unit later that evening 09/13/16>> Transfer back to ICU due to Respiratory distress  STUDIES:  09/12/16 CXR>> Patchy right lung base consolidation compatible with pneumonia. 09/13/16 CXR>> Bilateral lower lower lobes and right middle lobe patchy airspace consolidation. This may represent multifocal pneumonia, possibly aspiration pneumonia. Please follow-up to resolution to exclude underlying pulmonary masses. Enlarged cardiac silhouette and calcific atherosclerotic disease of the aorta.   HISTORY OF PRESENT ILLNESS:   Steven Medina is a 83 y.o. Male with a PMH of Stroke, Skin Cancer, Hypertension, Hyperlipidemia, CAD, Cancer, & A-fib. He presented to [facility] ED on 7/10 with c/o shortness of breath, cough, decreased PO intake, and being unsteady on his feet.  In the ED, he was tachypneic with low grade fever, his lactic acid was 2.3, and CXR was concerning for Pneumonia.  He was admitted to [place] unit for treatment of CAP, AECOPD, and possible sepsis.  PCCM was consulted for further management of CAP and sepsis.  Patient now with increased WOB and SOB, using accessory muscles to breathe now with increasing Shortness of breath and work of breathing, and lactic acid continues to trend upward.  REVIEW OF SYSTEMS:  Positives in BOLD Constitutional: Negative for fever, chills, weight loss, +malaise/fatigue and diaphoresis.  HENT: Negative for hearing loss, ear pain, nosebleeds, congestion, sore throat, neck pain, tinnitus and ear discharge.   Eyes: Negative  for blurred vision, double vision, photophobia, pain, discharge and redness.  Respiratory: Negative for +cough, hemoptysis, +sputum production, +shortness of breath, +wheezing and stridor.   Cardiovascular: Negative for chest pain, palpitations, orthopnea, claudication, leg swelling and PND.  Gastrointestinal: Negative for heartburn, nausea, vomiting, abdominal pain, diarrhea, constipation, blood in stool and melena.  Genitourinary: Negative for dysuria, urgency, frequency, hematuria and flank pain.  Musculoskeletal: Negative for myalgias, back pain, joint pain and falls.  Skin: Negative for itching and rash.  Neurological: Negative for dizziness, tingling, tremors, sensory change, speech change, focal weakness, seizures, loss of consciousness, weakness and headaches.  Endo/Heme/Allergies: Negative for environmental allergies and polydipsia. Does not bruise/bleed easily.   Temp:  [96.6 F (35.9 C)-98.2 F (36.8 C)] 96.6 F (35.9 C) (07/11 0535) Pulse Rate:  [101-223] 128 (07/11 0634) Resp:  [18-33] 32 (07/11 0634) BP: (105-153)/(57-116) 149/116 (07/11 0634) SpO2:  [84 %-98 %] 98 % (07/11 0651) Weight:  [84.1 kg (185 lb 8 oz)] 84.1 kg (185 lb 8 oz) (07/11 0535)  PHYSICAL EXAMINATION: General:  Acutely ill appearing, confused, caucasian male, laying in bed,Short of breath, in moderate respiratory distress Neuro:  Alert, confused, follows commands, CN II-XII intact, PERRLA HEENT:  Atraumatic, normocephalic, No JVD Cardiovascular:  Irregularly irregular rhythm, Tachycardia, No M/R/G, No edema Lungs:  Assessory muscle use, tachypnea, symmetrical expansion, Expiratory wheezing and rhonchi upon auscultation Abdomen:  Soft, Nontender, Nondistended, BS active x4  Musculoskeletal:  Active ROM, Musculoskeletal strength 5/5 Skin:  Scattered ecchymosis throughout; No rashes, lesion, or ulcerations apparent   Recent Labs Lab 09/12/16 0628 09/12/16 1246 09/13/16 0352  NA 135 133* 138  K 4.2  4.1 3.9  CL 100* 99* 104  CO2 25 22 22   BUN 17 16 13   CREATININE 0.80 0.89 0.97  GLUCOSE 123* 127* 167*    Recent Labs Lab 09/12/16 0628 09/12/16 1246 09/13/16 0352  HGB 16.2 15.9 16.0  HCT 48.5 46.2 46.9  WBC 5.0 4.4 9.1  PLT 124* 119* 133*    ASSESSMENT / PLAN:  83 yo obese white male with acute resp distress transferred back to ICU for increased WOB from acute COPD exacerbation with reactive airways disease with pneumonia with probable underlying pulmonary edema with acute afib with RVR with increasing LA from sepsis, probable underlying cardiomyopathy   PULMONARY: A: CAP AECOPD resp failure  P: Restart Rocephin IV, Continue Azithromycin Continue aggressive Bronchodilators & Budesonide nebs Obtain Echocardiogram  INFECTIOUS: A: Sepsis likely secondary to CAP Increasing Lactic Acid  P: Trend PCT & Lactic Acid Follow WBC's Follow Blood, Sputum, and Urine cultures Monitor fever curve ABX as above Will transfer to ICU  CARDIOVASCULAR: A: Atrial fib with RVR Hx: A-fib, CAD, HTN, Hyperlipidemia, Stroke  P: Cardiology consulted, appreciate input, follow recommendations Continue Metoprolol and Xarelto   Pt is critically ill and high risk for intubation  Critical Care Time devoted to patient care services described in this note is 40 minutes.   Overall, patient is critically ill, prognosis is guarded.  Patient with Multiorgan failure and at high risk for cardiac arrest and death.   Will address goals of care with family.   Corrin Parker, M.D.  [facility]  Medical Director [place] Director [place] Department            09/13/2016, 8:15 AM

## 2016-09-13 NOTE — Progress Notes (Signed)
Respiratory therapy in to administer nebulizer.  Audible gurgling continues.  Skin cool but pink.  Warm blanket given.  Oxygen saturation at 98% on 2LNC.  Atrial fib 120-130s at this time.

## 2016-09-13 NOTE — Progress Notes (Signed)
[place] [facility] [place] NAME: Steven Medina    MR#:  [id number]  DATE OF BIRTH:  02/07/1928  SUBJECTIVE:  CHIEF COMPLAINT:   Chief Complaint  Patient presents with  . Fall  Breathing got worse overnight and lactic acid trended up.  Requiring transfer back to ICU this morning Struggling to get phlegm out.  Has thick mucus REVIEW OF SYSTEMS:  Review of Systems  Constitutional: Positive for malaise/fatigue. Negative for chills, fever and weight loss.  HENT: Negative for nosebleeds and sore throat.   Eyes: Negative for blurred vision.  Respiratory: Positive for cough, sputum production and shortness of breath. Negative for wheezing.   Cardiovascular: Negative for chest pain, orthopnea, leg swelling and PND.  Gastrointestinal: Negative for abdominal pain, constipation, diarrhea, heartburn, nausea and vomiting.  Genitourinary: Negative for dysuria and urgency.  Musculoskeletal: Negative for back pain.  Skin: Negative for rash.  Neurological: Positive for weakness. Negative for dizziness, speech change, focal weakness and headaches.  Endo/Heme/Allergies: Does not bruise/bleed easily.  Psychiatric/Behavioral: Negative for depression.   DRUG ALLERGIES:  No Known Allergies VITALS:  Blood pressure (!) 195/160, pulse 93, temperature (!) 97.4 F (36.3 C), resp. rate (!) 21, height 5\' 7"  (1.702 m), weight 84.1 kg (185 lb 8 oz), SpO2 97 %. PHYSICAL EXAMINATION:  Physical Exam  Constitutional: He is oriented to person, place, and time. He appears toxic. He has a sickly appearance. He appears distressed.  HENT:  Head: Normocephalic and atraumatic.  Eyes: Conjunctivae and EOM are normal. Pupils are equal, round, and reactive to light.  Neck: Normal range of motion. Neck supple. No tracheal deviation present. No thyromegaly present.  Cardiovascular: Normal rate, regular rhythm and normal heart sounds.   Pulmonary/Chest: Accessory muscle usage  present. Tachypnea noted. He is in respiratory distress. He has decreased breath sounds in the right lower field and the left lower field. He has no wheezes. He has rhonchi in the right lower field. He exhibits no tenderness.  Abdominal: Soft. Bowel sounds are normal. He exhibits no distension. There is no tenderness.  Musculoskeletal: Normal range of motion.  Neurological: He is alert and oriented to person, place, and time. No cranial nerve deficit.  Skin: Skin is warm and dry. No rash noted.  Psychiatric: Mood and affect normal.   LABORATORY PANEL:  Male CBC  Recent Labs Lab 09/13/16 0352  WBC 9.1  HGB 16.0  HCT 46.9  PLT 133*   ------------------------------------------------------------------------------------------------------------------ Chemistries   Recent Labs Lab 09/12/16 1246 09/13/16 0352  NA 133* 138  K 4.1 3.9  CL 99* 104  CO2 22 22  GLUCOSE 127* 167*  BUN 16 13  CREATININE 0.89 0.97  CALCIUM 8.9 8.6*  AST 23  --   ALT 19  --   ALKPHOS 53  --   BILITOT 1.1  --    RADIOLOGY:  Dg Chest Port 1 View  Result Date: 09/13/2016 CLINICAL DATA:  Shortness of breath. EXAM: PORTABLE CHEST 1 VIEW COMPARISON:  09/12/2016 and prior chest radiographs. FINDINGS: The cardiac silhouette is enlarged. Mediastinal contours appear intact. Calcific atherosclerotic disease of the aorta. There is no evidence of pneumothorax. Patchy airspace consolidation with peripheral predominance are seen in bilateral lower lungs and right mid lung field. Biapical pleural thickening. Probably chronic fractures of several right-sided ribs with subsequent posttraumatic concave right-sided chest deformity. Soft tissues are grossly normal. IMPRESSION: Bilateral lower lower lobes and right middle lobe patchy airspace consolidation. This may represent multifocal  pneumonia, possibly aspiration pneumonia. Please follow-up to resolution to exclude underlying pulmonary masses. Enlarged cardiac silhouette and  calcific atherosclerotic disease of the aorta. Electronically Signed   By: Fidela Salisbury M.D.   On: 09/13/2016 09:01   ASSESSMENT AND PLAN:  83 year old male with a known history of Atrial fibrillation, coronary artery disease, hypertension is being admitted for possible sepsis due to pneumonia  * Sepsis: Present on admission - Likely source of infection - lung, Pneumonia - pending blood culture, urine and sputum culture - ID consult pending - Continue IV Rocephin and Zithromax - Aggressive IV fluid resuscitation per sepsis protocol - Intensivist following  * Community acquired pneumonia - Chest x-ray showing patchy, right lung base consolidation - Continue antibiotics as above  - ID consultation  * COPD exacerbation - Likely occupational exposure, 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 - Appreciate cardiology input  * Acute hypoxic respiratory failure - On IV steroids and antibiotics - PCCM following  * Hypertension - Continue amlodipine, lisinopril, metoprolol  He looks critically sick and high risk for cardio respiratory failure, multiorgan failure and death  After discussion with family, they seem to be leaning towards DO NOT RESUSCITATE  - We will transfer him to ICU for close monitoring, discussed with Dr. Mortimer Fries   All the records are reviewed and case discussed with Care Management/Social Worker. Management plans discussed with the patient, family and they are in agreement.  CODE STATUS: DNR  TOTAL TIME (critical care) TAKING CARE OF THIS PATIENT: 35 minutes.   More than 50% of the time was spent in counseling/coordination of care: YES    Max Sane M.D on 09/13/2016 at 1:58 PM  Between 7am to 6pm - Pager - [phone]  After 6pm go to [email or web address] - Proofreader  [employer]  [facility]  Office  [phone]  CC: Primary care physician; Steven Ramsay, MD  Note: This dictation was prepared with Dragon dictation along with smaller phrase technology. Any transcriptional errors that result from this process are unintentional.

## 2016-09-13 NOTE — Progress Notes (Signed)
While rounding, [place] noticed a woman crying in the ICU entrance. Woman stated that her father had received some bad news. [place] escorted woman (daughter) to room IC-08. [place] visited with Pt and family. Pt engaged Pt and family in conversation which led to a time of prayer. [place] provided prayer and comforting presence. [place] will follow up in the afternoon.    09/13/16 1500  Clinical Encounter Type  Visited With Patient;Family;Patient and family together  Visit Type Initial;Spiritual support;Critical Care  Referral From Family  Consult/Referral To Chaplain  Spiritual Encounters  Spiritual Needs Prayer;Emotional

## 2016-09-14 DIAGNOSIS — A419 Sepsis, unspecified organism: Principal | ICD-10-CM

## 2016-09-14 DIAGNOSIS — F0150 Vascular dementia without behavioral disturbance: Secondary | ICD-10-CM

## 2016-09-14 DIAGNOSIS — Z7189 Other specified counseling: Secondary | ICD-10-CM

## 2016-09-14 DIAGNOSIS — Z515 Encounter for palliative care: Secondary | ICD-10-CM

## 2016-09-14 LAB — BASIC METABOLIC PANEL
Anion gap: 12 (ref 5–15)
BUN: 19 mg/dL (ref 6–20)
CHLORIDE: 101 mmol/L (ref 101–111)
CO2: 22 mmol/L (ref 22–32)
CREATININE: 0.88 mg/dL (ref 0.61–1.24)
Calcium: 8.7 mg/dL — ABNORMAL LOW (ref 8.9–10.3)
GFR calc non Af Amer: >60 mL/min (ref >60)
GLUCOSE: 121 mg/dL — AB (ref 65–99)
Potassium: 4 mmol/L (ref 3.5–5.1)
Sodium: 135 mmol/L (ref 135–145)

## 2016-09-14 LAB — ECHOCARDIOGRAM COMPLETE
HEIGHTINCHES: 67 in
Weight: 2968 oz

## 2016-09-14 LAB — PROCALCITONIN: Procalcitonin: <0.1 ng/mL

## 2016-09-14 MED ORDER — AMIODARONE HCL IN DEXTROSE 360-4.14 MG/200ML-% IV SOLN
30.0000 mg/h | INTRAVENOUS | Status: DC
  Administered 2016-09-14: 30 mg/h via INTRAVENOUS

## 2016-09-14 MED ORDER — METOPROLOL TARTRATE 5 MG/5ML IV SOLN
2.5000 mg | INTRAVENOUS | Status: DC | PRN
  Administered 2016-09-14: 2.5 mg via INTRAVENOUS
  Administered 2016-09-14: 5 mg via INTRAVENOUS
  Administered 2016-09-14: 2.5 mg via INTRAVENOUS
  Filled 2016-09-14 (×2): qty 5

## 2016-09-14 MED ORDER — SODIUM CHLORIDE 0.9 % IV BOLUS (SEPSIS)
250.0000 mL | Freq: Once | INTRAVENOUS | Status: AC
  Administered 2016-09-14: 250 mL via INTRAVENOUS

## 2016-09-14 MED ORDER — AMIODARONE HCL IN DEXTROSE 360-4.14 MG/200ML-% IV SOLN
60.0000 mg/h | INTRAVENOUS | Status: AC
  Administered 2016-09-14: 60 mg/h via INTRAVENOUS
  Filled 2016-09-14 (×2): qty 200

## 2016-09-14 NOTE — Progress Notes (Signed)
[facility] [place] NAME: Steven Medina    MR#:  [id number]  DATE OF BIRTH:  August 10, 1927  SUBJECTIVE:  CHIEF COMPLAINT:   Chief Complaint  Patient presents with  . Fall  Breathing got worse overnight and lactic acid trended up.  Requiring transfer back to ICU this morning Struggling to get phlegm out.  Has thick mucus REVIEW OF SYSTEMS:  Review of Systems  Constitutional: Positive for malaise/fatigue. Negative for chills, fever and weight loss.  HENT: Negative for nosebleeds and sore throat.   Eyes: Negative for blurred vision.  Respiratory: Positive for cough, sputum production and shortness of breath. Negative for wheezing.   Cardiovascular: Negative for chest pain, orthopnea, leg swelling and PND.  Gastrointestinal: Negative for abdominal pain, constipation, diarrhea, heartburn, nausea and vomiting.  Genitourinary: Negative for dysuria and urgency.  Musculoskeletal: Negative for back pain.  Skin: Negative for rash.  Neurological: Positive for weakness. Negative for dizziness, speech change, focal weakness and headaches.  Endo/Heme/Allergies: Does not bruise/bleed easily.  Psychiatric/Behavioral: Negative for depression.   DRUG ALLERGIES:  No Known Allergies VITALS:  Blood pressure 116/67, pulse (!) 101, temperature 98.1 F (36.7 C), resp. rate (!) 21, height 5\' 8"  (1.727 m), weight 81 kg (178 lb 9.2 oz), SpO2 97 %. PHYSICAL EXAMINATION:  Physical Exam  Constitutional: He is oriented to person, place, and time. He appears toxic. He has a sickly appearance. He appears distressed.  HENT:  Head: Normocephalic and atraumatic.  Eyes: Pupils are equal, round, and reactive to light. Conjunctivae and EOM are normal.  Neck: Normal range of motion. Neck supple. No tracheal deviation present. No thyromegaly present.  Cardiovascular: Normal rate, regular rhythm and normal heart sounds.   Pulmonary/Chest: Accessory muscle usage present.  Tachypnea noted. He is in respiratory distress. He has decreased breath sounds in the right lower field and the left lower field. He has no wheezes. He has rhonchi in the right lower field. He exhibits no tenderness.  Abdominal: Soft. Bowel sounds are normal. He exhibits no distension. There is no tenderness.  Musculoskeletal: Normal range of motion.  Neurological: He is alert and oriented to person, place, and time. No cranial nerve deficit.  Skin: Skin is warm and dry. No rash noted.  Psychiatric: Mood and affect normal.   LABORATORY PANEL:  Male CBC  Recent Labs Lab 09/13/16 0352  WBC 9.1  HGB 16.0  HCT 46.9  PLT 133*   ------------------------------------------------------------------------------------------------------------------ Chemistries   Recent Labs Lab 09/12/16 1246  09/14/16 0312  NA 133*  < > 135  K 4.1  < > 4.0  CL 99*  < > 101  CO2 22  < > 22  GLUCOSE 127*  < > 121*  BUN 16  < > 19  CREATININE 0.89  < > 0.88  CALCIUM 8.9  < > 8.7*  AST 23  --   --   ALT 19  --   --   ALKPHOS 53  --   --   BILITOT 1.1  --   --   < > = values in this interval not displayed. RADIOLOGY:  No results found. ASSESSMENT AND PLAN:  83 year old male with a known history of Atrial fibrillation, coronary artery disease, hypertension is being admitted for possible sepsis due to pneumonia  * Sepsis: Present on admission - Likely source of infection - lung, Pneumonia, Aspiration kind - pending blood culture, urine and sputum culture - ID input appreciated - Changed to  IV zosyn and Zithromax per ID - Intensivist following  * Community acquired pneumonia - Chest x-ray showing patchy, right lung base consolidation - Continue antibiotics as above  - ID consultation  * COPD exacerbation - Likely occupational exposure, 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 - Appreciate cardiology input  * Acute hypoxic respiratory failure - On IV steroids and antibiotics - PCCM following  * Hypertension - Continue amlodipine, lisinopril, metoprolol     All the records are reviewed and case discussed with Care Management/Social Worker. Management plans discussed with the patient, family and they are in agreement.  CODE STATUS: DNR  TOTAL TIME TAKING CARE OF THIS PATIENT: 15 minutes.   More than 50% of the time was spent in counseling/coordination of care: YES    Max Sane M.D on 09/14/2016 at 2:55 PM  Between 7am to 6pm - Pager - [phone]  After 6pm go to [email or web address] - Proofreader  [facility]  Office  [phone]  CC: Primary care physician; Leonel Ramsay, MD  Note: This dictation was prepared with Dragon dictation along with smaller phrase technology. Any transcriptional errors that result from this process are unintentional.

## 2016-09-14 NOTE — Progress Notes (Signed)
[place] INFECTIOUS DISEASE PROGRESS NOTE Date of Admission:  09/12/2016     ID: Steven Medina is a 83 y.o. male with PNA Active Problems:   Sepsis ([place])   Subjective: Remains in ICU on high flo. No fevers. Remains confused.   ROS  Unable to obtain  Medications:  Antibiotics Given (last 72 hours)    Date/Time Action Medication Dose Rate   09/12/16 0749 New Bag/Given   azithromycin (ZITHROMAX) 500 mg in dextrose 5 % 250 mL IVPB 500 mg 250 mL/hr   09/12/16 0849 New Bag/Given   cefTRIAXone (ROCEPHIN) 1 g in dextrose 5 % 50 mL IVPB 1 g 100 mL/hr   09/13/16 0927 New Bag/Given   cefTRIAXone (ROCEPHIN) 1 g in dextrose 5 % 50 mL IVPB 1 g 100 mL/hr   09/13/16 1107 New Bag/Given   azithromycin (ZITHROMAX) 500 mg in dextrose 5 % 250 mL IVPB 500 mg 250 mL/hr   09/13/16 1714 New Bag/Given   piperacillin-tazobactam (ZOSYN) IVPB 3.375 g 3.375 g 12.5 mL/hr   09/13/16 2317 New Bag/Given   piperacillin-tazobactam (ZOSYN) IVPB 3.375 g 3.375 g 12.5 mL/hr   09/14/16 0741 New Bag/Given   piperacillin-tazobactam (ZOSYN) IVPB 3.375 g 3.375 g 12.5 mL/hr   09/14/16 1059 New Bag/Given   azithromycin (ZITHROMAX) 500 mg in dextrose 5 % 250 mL IVPB 500 mg 250 mL/hr     . amLODipine  5 mg Oral Daily  . budesonide (PULMICORT) nebulizer solution  0.5 mg Nebulization BID  . ipratropium-albuterol  3 mL Nebulization Q4H  . lisinopril  5 mg Oral Daily  . methylPREDNISolone (SOLU-MEDROL) injection  40 mg Intravenous Daily  . metoprolol tartrate  25 mg Oral BID  . pantoprazole  40 mg Oral Daily  . rivaroxaban  20 mg Oral Q supper  . traZODone  50 mg Oral QHS    Objective: Vital signs in last 24 hours: Temp:  [96.1 F (35.6 C)-98.1 F (36.7 C)] 98.1 F (36.7 C) (07/12 1200) Pulse Rate:  [97-142] 97 (07/12 1200) Resp:  [17-33] 20 (07/12 1200) BP: (81-151)/(58-115) 81/58 (07/12 1200) SpO2:  [91 %-99 %] 97 % (07/12 1200) FiO2 (%):  [40 %] 40 % (07/12 1112) Constitutional: He is faril, somewhat  confused HENT: anicteric Mouth/Throat: Oropharynx is clear and moist. No oropharyngeal exudate.  Cardiovascular: Tachy,   Pulmonary/Chest: poor air movement Abdominal: Soft. Bowel sounds are normal. He exhibits no distension. There is no tenderness.  Lymphadenopathy:  He has no cervical adenopathy.  Neurological: He is awake but somewhat confused Skin: Skin is warm and dry. No rash noted. No erythema.  Psychiatric: He has a normal mood and affect. His behavior is normal.   Lab Results  Recent Labs  09/12/16 1246 09/13/16 0352 09/14/16 0312  WBC 4.4 9.1  --   HGB 15.9 16.0  --   HCT 46.2 46.9  --   NA 133* 138 135  K 4.1 3.9 4.0  CL 99* 104 101  CO2 22 22 22   BUN 16 13 19   CREATININE 0.89 0.97 0.88    Microbiology: Results for orders placed or performed during the hospital encounter of 09/12/16  Culture, blood (Routine x 2)     Status: None (Preliminary result)   Collection Time: 09/12/16  6:28 AM  Result Value Ref Range Status   Specimen Description BLOOD  LEFT FOREARM   Final   Special Requests   Final    BOTTLES DRAWN AEROBIC AND ANAEROBIC Blood Culture adequate volume   Culture NO  GROWTH 2 DAYS  Final   Report Status PENDING  Incomplete  Culture, blood (Routine x 2)     Status: None (Preliminary result)   Collection Time: 09/12/16  6:28 AM  Result Value Ref Range Status   Specimen Description BLOOD  LEFT AC  Final   Special Requests   Final    BOTTLES DRAWN AEROBIC AND ANAEROBIC Blood Culture results may not be optimal due to an excessive volume of blood received in culture bottles   Culture NO GROWTH 2 DAYS  Final   Report Status PENDING  Incomplete  Urine culture     Status: Abnormal   Collection Time: 09/12/16  7:17 AM  Result Value Ref Range Status   Specimen Description URINE, RANDOM  Final   Special Requests NONE  Final   Culture (A)  Final    <10,000 COLONIES/mL INSIGNIFICANT GROWTH Performed at [facility], [place] [address] [place], [place]  [address]    Report Status 09/13/2016 FINAL  Final  Culture, blood (x 2)     Status: None (Preliminary result)   Collection Time: 09/12/16 12:30 PM  Result Value Ref Range Status   Specimen Description BLOOD RIGHT HAND  Final   Special Requests   Final    BOTTLES DRAWN AEROBIC AND ANAEROBIC Blood Culture adequate volume   Culture NO GROWTH 2 DAYS  Final   Report Status PENDING  Incomplete  MRSA PCR Screening     Status: None   Collection Time: 09/12/16 12:31 PM  Result Value Ref Range Status   MRSA by PCR NEGATIVE NEGATIVE Final    Comment:        The GeneXpert MRSA Assay (FDA approved for NASAL specimens only), is one component of a comprehensive MRSA colonization surveillance program. It is not intended to diagnose MRSA infection nor to guide or monitor treatment for MRSA infections.   Culture, blood (x 2)     Status: None (Preliminary result)   Collection Time: 09/12/16 12:46 PM  Result Value Ref Range Status   Specimen Description BLOOD  LEFT HAND   Final   Special Requests   Final    BOTTLES DRAWN AEROBIC AND ANAEROBIC Blood Culture adequate volume   Culture NO GROWTH 2 DAYS  Final   Report Status PENDING  Incomplete    Studies/Results: Dg Chest Port 1 View  Result Date: 09/13/2016 CLINICAL DATA:  Shortness of breath. EXAM: PORTABLE CHEST 1 VIEW COMPARISON:  09/12/2016 and prior chest radiographs. FINDINGS: The cardiac silhouette is enlarged. Mediastinal contours appear intact. Calcific atherosclerotic disease of the aorta. There is no evidence of pneumothorax. Patchy airspace consolidation with peripheral predominance are seen in bilateral lower lungs and right mid lung field. Biapical pleural thickening. Probably chronic fractures of several right-sided ribs with subsequent posttraumatic concave right-sided chest deformity. Soft tissues are grossly normal. IMPRESSION: Bilateral lower lower lobes and right middle lobe patchy airspace consolidation. This may represent  multifocal pneumonia, possibly aspiration pneumonia. Please follow-up to resolution to exclude underlying pulmonary masses. Enlarged cardiac silhouette and calcific atherosclerotic disease of the aorta. Electronically Signed   By: Fidela [place] M.D.   On: 09/13/2016 09:01    Assessment/Plan: Daylon Lafavor is a 83 y.o. male admitted with RLL pna following a cough for 1 week. Family denies any aspiration events but some confusion.  He was improving but then worsened and is on Hi Flow o2. Intermittent confusion. CXR worsening. I suspect aspiration.   Recommendations Continue  zosyn for aspiration coverage No  need for vanco since MRSA PCR neg Cont azithro x 5 days follow clinically. He is DNR. Discussed with his wife and daughter. I am also his PCP.  Thank you very much for the consult. Will follow with you.  Krystale Rinkenberger P   09/14/2016, 2:06 PM

## 2016-09-14 NOTE — Consult Note (Signed)
Consultation Note Date: 09/14/2016   Patient Name: Steven Medina  DOB: [birth year]  MRN: [id number]  Age / Sex: 83 y.o., male  PCP: Leonel Ramsay, MD Referring Physician: Max Sane, MD  Reason for Consultation: Establishing goals of care  HPI/Patient Profile: 83 y.o. male  with past medical history of CVA, CAD, Afib, COPD, and mild dementia who was admitted on 09/12/2016 with sepsis secondary to pneumonia and rapid afib. Since admission his lactic acid has remained high (4 range), and he is suffering with confusion/delirium.  He has been placed on high flow nasal canula (45L at 40%) for acute hypoxic resp failure secondary to pneumonia, COPD and a possible aspiration event.  His wife struggle with the decision about whether or not to intubate, but the patient had a lucid moment and when asked about intubation he replied firmly "No".  Clinical Assessment and Goals of Care:   I have reviewed medical records including EPIC notes, labs and imaging, received report from CCM, assessed the patient and then met at the bedside along with his wife and grand dtr  to discuss diagnosis prognosis, GOC, EOL wishes, disposition and options.  I introduced Palliative Medicine as specialized medical care for people living with serious illness. It focuses on providing relief from the symptoms and stress of a serious illness. The goal is to improve quality of life for both the patient and the family.  We discussed a brief life review of the patient. 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  They have a large supportive family and receive support from their faith.  I talked with his wife about his values.  She tells me they filled out advanced directives years ago - but its very difficult when you are faced with an emergent situation and the doctors are asking  you what you want.  Jaz is an independent person who enjoys working hard and does not want to be a burden to anyone.  He is also very claustrophobic and could not tolerate and MRI or being intubated.  She does not know if he could tolerate BiPap.  Mrs. Wiswell mentioned that she was told today that Kaine would need to go to [facility] after discharge.  This was concerning to her.  Waris has been thru SNF rehab before and it was very difficult for him.  She had to stay with him all of the time.  If she was not there, Gerado was lost.    I explained to Mrs. Beverley that [place] doesn't necessarily have to go to SNF rehab or LTAC.  There are other options that we can explore if he improves towards the end of the hospitalization- specifically, we discussed Hospice services at home or at hospice house.  After our conversation, her face softened and she said she was so relieved.  She wants Enos to be comfortable and peaceful.  She appreciated knowing her options.  We then talked about his  current predicament.  I explained that high flow nasal canula at 45L is a great deal of oxygen and is only available in the hospital.  He will have to be able improve enough to drop to at least 15L to be able to leave the hospital.  She seemed to understand.  We discussed aspiration pneumonia and I asked what Troyce would say about a PEG tube.  She quickly replied absolutely not.   Hospice and Palliative Care services outpatient were explained and offered.  Questions and concerns were addressed.  Hard Choices booklet left for review. The family was encouraged to call with questions or concerns.  PMT will continue to support holistically.   Primary Decision Maker:  NEXT OF KIN Wife    SUMMARY OF RECOMMENDATIONS    22 yo gentleman with delirium, persistently elevated lactic acid, and acute hypoxic respiratory failure.   If he does not improve with existing measures after a reasonable period of time, then transitioning to  comfort measures would be a very reasonable move.   If he does improve - home with hospice or d/c to hospice house are possible options.  With any decision -  Mrs. Bussiere and her family should be made to feel good about their decisions.  Code Status/Advance Care Planning:  DNR   No PEG tube   Symptom Management:   Per CCM.    May consider Zyprexa for ICU delirium if felt to be appropriate by the primary team.  Psycho-social/Spiritual:   Desire for further Chaplaincy support: yes  Prognosis:   Unable to determine.  Likely limited prognosis given advanced age, dementia, afib, and acute chronic respiratory disease  Discharge Planning: To Be Determined.  Hospital death would not be surprising.      Primary Diagnoses: Present on Admission: . Sepsis ([place])   I have reviewed the medical record, interviewed the patient and family, and examined the patient. The following aspects are pertinent.  Past Medical History:  Diagnosis Date  . A-fib ([place])   . Cancer ([place])   . Coronary artery disease   . Hyperlipemia   . Hypertension   . Skin cancer   . Stroke [facility])    Social History   Social History  . Marital status: Married    Spouse name: N/A  . Number of children: N/A  . Years of education: N/A   Social History Main Topics  . Smoking status: Former Research scientist (life sciences)  . Smokeless tobacco: Former Systems developer  . Alcohol use 0.6 oz/week    1 Glasses of wine per week  . Drug use: No  . Sexual activity: Not Asked   Other Topics Concern  . None   Social History Narrative  . None   No family history on file. Scheduled Meds: . amLODipine  5 mg Oral Daily  . budesonide (PULMICORT) nebulizer solution  0.5 mg Nebulization BID  . ipratropium-albuterol  3 mL Nebulization Q4H  . lisinopril  5 mg Oral Daily  . methylPREDNISolone (SOLU-MEDROL) injection  40 mg Intravenous Daily  . metoprolol tartrate  25 mg Oral BID  . pantoprazole  40 mg Oral Daily  . rivaroxaban  20 mg Oral Q supper    . traZODone  50 mg Oral QHS   Continuous Infusions: . amiodarone 60 mg/hr (09/14/16 0955)  . amiodarone    . azithromycin 500 mg (09/14/16 1059)  . piperacillin-tazobactam (ZOSYN)  IV 3.375 g (09/14/16 0741)   PRN Meds:.acetaminophen, metoprolol tartrate, ondansetron (ZOFRAN) IV No Known Allergies Review of Systems  patient unable to communicate, lethargic  Physical Exam  Well developed elderly gentleman, sleeping, appears comfortable on high flow N/C CV irreg and tacy Abdomen soft nt, nd  Vital Signs: BP 104/78   Pulse (!) 142   Temp 97.8 F (36.6 C) (Oral)   Resp 19   Ht 5' 8"  (1.727 m)   Wt 81 kg (178 lb 9.2 oz)   SpO2 97%   BMI 27.15 kg/m  Pain Assessment: No/denies pain       SpO2: SpO2: 97 % O2 Device:SpO2: 97 % O2 Flow Rate: .O2 Flow Rate (L/min): 45 L/min  IO: Intake/output summary:  Intake/Output Summary (Last 24 hours) at 09/14/16 1139 Last data filed at 09/14/16 1100  Gross per 24 hour  Intake             53.3 ml  Output              275 ml  Net           -221.7 ml    LBM: Last BM Date: 09/12/16 Baseline Weight: Weight: 79.4 kg (175 lb) Most recent weight: Weight: 81 kg (178 lb 9.2 oz)     Palliative Assessment/Data:   Flowsheet Rows     Most Recent Value  Intake Tab  Referral Department  Hospitalist  Unit at Time of Referral  ICU  Palliative Care Primary Diagnosis  Pulmonary  Date Notified  09/12/16  Palliative Care Type  New Palliative care  Reason for referral  Clarify Goals of Care  Date of Admission  09/12/16  # of days IP prior to Palliative referral  0  Clinical Assessment  Psychosocial & Spiritual Assessment  Palliative Care Outcomes      Time In: 10:00 Time Out: 11:10 Time Total: 70 min. Greater than 50%  of this time was spent counseling and coordinating care related to the above assessment and plan.  Signed by: Florentina Jenny, PA-C Palliative Medicine Pager: [phone]  Please contact Palliative Medicine Team  phone at [phone] for questions and concerns.  For individual provider: See Shea Evans

## 2016-09-14 NOTE — Progress Notes (Signed)
Heart rate greater than 115. Given prn Metoprolol 2.5

## 2016-09-14 NOTE — Progress Notes (Signed)
Initial Nutrition Assessment  DOCUMENTATION CODES:   Not applicable  INTERVENTION:   Magic cup TID with meals, each supplement provides 290 kcal and 9 grams of protein  RD will order appropriate supplements pending SLP evaluation   NUTRITION DIAGNOSIS:   Inadequate oral intake related to  (stroke w/ possible dysphagia, dementia ) as evidenced by meal completion < 25%.  GOAL:   Patient will meet greater than or equal to 90% of their needs  MONITOR:   PO intake, Supplement acceptance, Labs, Weight trends  REASON FOR ASSESSMENT:   Low [place]    ASSESSMENT:   83 y.o. Male with a PMH of Stroke, Skin Cancer, Hypertension, Hyperlipidemia, CAD, Cancer, & A-fib. He presented to [facility] ED on 7/10 with c/o shortness of breath, cough, decreased PO intake, and being unsteady on his feet.  In the ED, he was tachypneic with low grade fever, his lactic acid was 2.3, and CXR was concerning for Pneumonia.  He was admitted to [place] unit for treatment of CAP, AECOPD, and possible sepsis.   Unable to communicate with pt today; history obtained from granddaughter at bedside. Granddaughter reports that pt is usually a good eater at home and that pt is weight stable. Pt ate 25% of his breakfast this morning; however, due to concern for possible dysphagia and aspiration pneumonia, pt was made NPO today pending SLP evaluation. RD will order Magic Cups and appropriate supplements pending SLP recommendations. Palliative care consult pending.     Medications reviewed and include: solu-medrol, protonix  Labs reviewed: Ca 8.7(L) cbgs- 123, 127, 167, 121 x 48 hrs  Nutrition-Focused physical exam completed. Findings are no fat depletion, no muscle depletion, and moderate edema in BLE.   Diet Order:  Diet NPO time specified  Skin:  Wound (see comment) (skin tear hand)  Last BM:  7/10  Height:   Ht Readings from Last 1 Encounters:  09/13/16 5\' 8"  (1.727 m)    Weight:   Wt Readings from  Last 1 Encounters:  09/13/16 178 lb 9.2 oz (81 kg)    Ideal Body Weight:  70 kg  BMI:  Body mass index is 27.15 kg/m.  Estimated Nutritional Needs:   Kcal:  2000-2300kcal/day   Protein:  81-97g/day   Fluid:  >2L/day   EDUCATION NEEDS:   Education needs no appropriate at this time  Koleen Distance MS, RD, [place] Pager #[phone] After Hours Pager: [phone]

## 2016-09-14 NOTE — Progress Notes (Signed)
CH made a follow up visit. Pt is intubated and unable to respond at this time. [place] spoke with two granddaughters in the room, and several family members in the [place] hallway. Granddaughter stated that at this point, the Pt progress seemed flat. Much concern was voiced for the Grandmother (Wife). CH offered prayer and presence.     09/14/16 1500  Clinical Encounter Type  Visited With Family  Visit Type Follow-up;Critical Care  Consult/Referral To Chaplain  Spiritual Encounters  Spiritual Needs Emotional

## 2016-09-14 NOTE — Progress Notes (Signed)
Name: Steven Medina MRN: [id number] DOB: 12/27/1927    ADMISSION DATE:  09/12/2016 CONSULTATION DATE:  09/12/2016  REFERRING MD : Dr. Manuella Ghazi  CHIEF COMPLAINT:  Acute Respiratory Distress  BRIEF PATIENT DESCRIPTION:  83 y.o. Male admitted on 09/12/16 with Acute Respiratory Distress secondary to CAP and AECOPD, and Sepsis.  He was transferred out of ICU on 09/12/16.  SIGNIFICANT EVENTS  09/12/16>> Admission to [facility] ICU, transfer to Telemetry unit later that evening 09/13/16>> Transfer back to ICU due to Respiratory distress  STUDIES:  09/12/16 CXR>> Patchy right lung base consolidation compatible with pneumonia. 09/13/16 CXR>> Bilateral lower lower lobes and right middle lobe patchy airspace consolidation. This may represent multifocal pneumonia, possibly aspiration pneumonia. Please follow-up to resolution to exclude underlying pulmonary masses. Enlarged cardiac silhouette and calcific atherosclerotic disease of the aorta.   HISTORY OF PRESENT ILLNESS:   Mr. Steven Medina is a 83 y.o. Male with a PMH of Stroke, Skin Cancer, Hypertension, Hyperlipidemia, CAD, Cancer, & A-fib. He presented to [facility] ED on 7/10 with c/o shortness of breath, cough, decreased PO intake, and being unsteady on his feet.  In the ED, he was tachypneic with low grade fever, his lactic acid was 2.3, and CXR was concerning for Pneumonia.  He was admitted to [facility] unit for treatment of CAP, AECOPD, and possible sepsis.  PCCM was consulted for further management of CAP and sepsis.  Patient now with increased WOB and SOB, using accessory muscles to breathe now with increasing Shortness of breath and work of breathing, and lactic acid continues to trend upward.  REVIEW OF SYSTEMS:  Positives in BOLD Constitutional: Negative for fever, chills, weight loss, +malaise/fatigue and diaphoresis.  HENT: Negative for hearing loss, ear pain, nosebleeds, congestion, sore throat, neck pain, tinnitus and ear discharge.   Eyes:  Negative for blurred vision, double vision, photophobia, pain, discharge and redness.  Respiratory: Negative for +cough, hemoptysis, +sputum production, +shortness of breath, +wheezing and stridor.   Cardiovascular: Negative for chest pain, palpitations, orthopnea, claudication, leg swelling and PND.  Gastrointestinal: Negative for heartburn, nausea, vomiting, abdominal pain, diarrhea, constipation, blood in stool and melena.  Genitourinary: Negative for dysuria, urgency, frequency, hematuria and flank pain.  Musculoskeletal: Negative for myalgias, back pain, joint pain and falls.  Skin: Negative for itching and rash.  Neurological: Negative for dizziness, tingling, tremors, sensory change, speech change, focal weakness, seizures, loss of consciousness, weakness and headaches.  Endo/Heme/Allergies: Negative for environmental allergies and polydipsia. Does not bruise/bleed easily.   Temp:  [96.1 F (35.6 C)-97.8 F (36.6 C)] 97.1 F (36.2 C) (07/11 1945) Pulse Rate:  [91-142] 142 (07/12 0300) Resp:  [17-33] 29 (07/12 0300) BP: (104-195)/(65-160) 110/88 (07/12 0200) SpO2:  [91 %-99 %] 95 % (07/12 0351) FiO2 (%):  [40 %] 40 % (07/12 0351) Weight:  [178 lb 9.2 oz (81 kg)] 178 lb 9.2 oz (81 kg) (07/11 1100)  PHYSICAL EXAMINATION: General:  Acutely ill appearing, confused, caucasian male, laying in bed,Short of breath, in moderate respiratory distress Neuro:  Alert, confused, follows commands, CN II-XII intact, PERRLA HEENT:  Atraumatic, normocephalic, No JVD Cardiovascular:  Irregularly irregular rhythm, Tachycardia, No M/R/G, No edema Lungs: diminished,occassional expiratory wheezes, no crackles or rhonchi noted  Abdomen:  Soft, Nontender, Nondistended, BS active x4  Musculoskeletal:  Active ROM, Musculoskeletal strength 5/5 Skin:  Scattered ecchymosis throughout; No rashes, lesion, or ulcerations apparent   Recent Labs Lab 09/12/16 0628 09/12/16 1246 09/13/16 0352  NA 135 133* 138    K 4.2 4.1  3.9  CL 100* 99* 104  CO2 25 22 22   BUN 17 16 13   CREATININE 0.80 0.89 0.97  GLUCOSE 123* 127* 167*    Recent Labs Lab 09/12/16 0628 09/12/16 1246 09/13/16 0352  HGB 16.2 15.9 16.0  HCT 48.5 46.2 46.9  WBC 5.0 4.4 9.1  PLT 124* 119* 133*    Subjective:  7/12:  Patient was  Confused and restless for most part of the night .  Slept  On and off. Afebrile. No major issues.  ASSESSMENT / PLAN:  83 yo obese white male with acute resp distress transferred back to ICU for increased WOB from acute COPD exacerbation with reactive airways disease with pneumonia with probable underlying pulmonary edema with acute afib with RVR with increasing LA from sepsis, probable underlying cardiomyopathy   PULMONARY: A: CAP AECOPD resp failure  P: Continue Azithromycin Continue Zosyn bronchodilators Continue Steroids,taper  INFECTIOUS: A: Sepsis likely secondary to CAP Lactic acidosis  P: Follow WBC's Follow Blood, Sputum, and Urine cultures Monitor fever curve ABX as above   CARDIOVASCULAR: A: Atrial fib with RVR Hx: A-fib, CAD, HTN, Hyperlipidemia, Stroke  P: Cardiology consulted, appreciate input, follow recommendations Continue Metoprolol /Amlodipine/lisinopril Continue Rivaroxaban Metoprolol I/v to keep HR<115     Shedric Fredericks,AG-ACNP Pulmonary & Critical Care   09/14/2016, 6:00 AM

## 2016-09-14 NOTE — Progress Notes (Signed)
Slept most of the day.Remains in Atrial Fib. ,Heart rate more controlled on Nexerone drip. Remains confused except to self and family but pleasant. Also remains incontinent of urine.

## 2016-09-14 NOTE — Evaluation (Signed)
Clinical/Bedside Swallow Evaluation Patient Details  Name: Steven Medina MRN: [id number] Date of Birth: 23-May-1927  Today's Date: 09/14/2016 Time: SLP Start Time (ACUTE ONLY): 1545 SLP Stop Time (ACUTE ONLY): 1638 SLP Time Calculation (min) (ACUTE ONLY): 53 min  Past Medical History:  Past Medical History:  Diagnosis Date  . A-fib ([place])   . Cancer ([place])   . Coronary artery disease   . Hyperlipemia   . Hypertension   . Skin cancer   . Stroke [facility])    Past Surgical History:  Past Surgical History:  Procedure Laterality Date  . FRACTURE SURGERY      PAST MEDICAL HISTORY:   HPI: Per admitting History and Physical:   Steven Medina  is a 83 y.o. male with a known history of Atrial fibrillation, coronary artery disease, hypertension is being admitted for possible sepsis due to pneumonia.  He also had rapid atrial fibrillation. Wife reports that patient has dementia and may have had a mini stroke about 6 months ago which has left him with some difficulty with balance. He has gone through physical therapy and rehabilitation and currently receives PT and OT at home. He has been doing relatively well. 3 days ago he developed a cough and decreased by mouth intake. Earlier this morning he became unsteady on his feet and she was able to help him to the floor. He did not fall or injure himself. He is on blood thinners for atrial fibrillation.  Transferred to CCU 7/11 as breathing worsened. Wife also reports coughing episode with eggs 7/12 this morning.  Assessment / Plan / Recommendation   Clinical Impression  Pt presents with functional swallowing at bedside but is at moderate risk for aspiration due to his decline in overall condietion and poor respiratory status. Pt tolerated thin liquids, pureed foods, and solid foods without s/s of aspiration. Liquids were given in very small amounts with no continuous sips given. Oral tranist delay with solids although Pt was able to clear oral cavity  adequately. Vocal quality remained clear throughout assessment, laryngeal elevation appeared adequate. Pt currently has high flow nasal canula and fatigues easily. Given current condition, and possible aspiration event earlier, will order diet as Dysphagi 1 with honey thick liquids until further assessment and respiratory status improves. Discussed with wife, only feed when fully alert, strict aspiration precautions. meds crushed (if crushable) in applesauce or whole with thick liquids. SLP Visit Diagnosis: Dysphagia, oropharyngeal phase (R13.12)    Aspiration Risk  Moderate aspiration risk    Diet Recommendation Dysphagia 1 (Puree);Honey-thick liquid   Medication Administration: Crushed with puree Supervision: Full supervision/cueing for compensatory strategies Compensations: Slow rate;Minimize environmental distractions;Small sips/bites Postural Changes: Seated upright at 90 degrees;Remain upright for at least 30 minutes after po intake    Other  Recommendations     Follow up Recommendations        Frequency and Duration min 2x/week  1 week       Prognosis Prognosis for Safe Diet Advancement: Fair      Swallow Study   General Type of Study: Bedside Swallow Evaluation Diet Prior to this Study: NPO Temperature Spikes Noted: No Respiratory Status: Other (comment) (High flow nasal) Behavior/Cognition: Cooperative;Pleasant mood Oral Cavity - Dentition: Dentures, top Self-Feeding Abilities: Total assist Patient Positioning: Upright in bed Baseline Vocal Quality: Breathy;Hoarse Volitional Cough: Strong Volitional Swallow: Able to elicit    Oral/Motor/Sensory Function Overall Oral Motor/Sensory Function: Within functional limits   Ice Chips Ice chips: Within functional limits Presentation: Spoon   Thin  Liquid Thin Liquid: Within functional limits Presentation: Cup;Spoon;Straw    Nectar Thick Presentation: Cup   Honey Thick Honey Thick Liquid: Within functional  limits Presentation: Cup   Puree Puree: Within functional limits Presentation: Spoon   Solid   GO   Solid: Impaired (Slow oral tranist) Oral Phase Impairments: Impaired mastication;Reduced lingual movement/coordination Oral Phase Functional Implications: Prolonged oral transit        [name] 09/14/2016,4:39 PM

## 2016-09-15 DIAGNOSIS — J181 Lobar pneumonia, unspecified organism: Secondary | ICD-10-CM

## 2016-09-15 DIAGNOSIS — J189 Pneumonia, unspecified organism: Secondary | ICD-10-CM

## 2016-09-15 LAB — LEGIONELLA PNEUMOPHILA SEROGP 1 UR AG: L. pneumophila Serogp 1 Ur Ag: NEGATIVE

## 2016-09-15 LAB — PROCALCITONIN: Procalcitonin: <0.1 ng/mL

## 2016-09-15 MED ORDER — GLYCOPYRROLATE 0.2 MG/ML IJ SOLN
0.2000 mg | INTRAMUSCULAR | Status: DC | PRN
  Filled 2016-09-15: qty 1

## 2016-09-15 MED ORDER — MORPHINE SULFATE (CONCENTRATE) 10 MG/0.5ML PO SOLN: 10.0000 mg | ORAL | Status: DC | PRN

## 2016-09-15 MED ORDER — BISACODYL 10 MG RE SUPP: 10.0000 mg | Freq: Every day | RECTAL | Status: DC | PRN

## 2016-09-15 MED ORDER — LORAZEPAM 2 MG/ML IJ SOLN
1.0000 mg | INTRAMUSCULAR | Status: DC | PRN
Start: 2016-09-15 — End: 2016-09-18

## 2016-09-15 MED ORDER — MORPHINE SULFATE (CONCENTRATE) 10 MG/0.5ML PO SOLN: 5.0000 mg | ORAL | Status: DC | PRN

## 2016-09-15 MED ORDER — IPRATROPIUM-ALBUTEROL 0.5-2.5 (3) MG/3ML IN SOLN: 3.0000 mL | RESPIRATORY_TRACT | Status: DC | PRN

## 2016-09-15 MED ORDER — MORPHINE SULFATE (PF) 2 MG/ML IV SOLN: 2.0000 mg | INTRAVENOUS | Status: DC | PRN

## 2016-09-15 NOTE — Progress Notes (Signed)
Pt was very sleepy through out the night, responded to name but to commands until 5am when bath given and he followed all commands, but still confused. Pt b/p drop in the night to 70's/40's, per MD to stop Amio drip and pressure has substained in 90's/50's. Pt stable at this time, with controlled heart rate in 80's.

## 2016-09-15 NOTE — Progress Notes (Signed)
Daily Progress Note   Patient Name: Steven Medina       Date: 09/15/2016 DOB: 07-20-1927  Age: 83 y.o. MRN#: [id number] Attending Physician: Max Sane, MD Primary Care Physician: Leonel Ramsay, MD Admit Date: 09/12/2016  Reason for Consultation/Follow-up: Establishing goals of care  Subjective/GOC:  Patient drowsy. Will open eyes to voice but with minimal responses. Shallow respirations. Comfortable on room air. No signs or symptoms of discomfort. Multiple family members at bedside.   Met with wife Arville Go) and daughter Tilda Burrow) in family waiting room following a conversation with Dr. Manuella Ghazi. They share a brief life review of the patient. Married to wife for 47 years. Two adult children and many other supportive family members. Wife speaks of decline since Fall in March with weakness and confusion. Also hx of stroke and COPD. Poor appetite and increased confusion/forgetfulness.   Patient with clinical improvement today--off high flow and out of ICU. Family speaks of wanting comfort and quality for him. They discussed comfort measures with the patient earlier and he stated "stop the meds." Wife and daughter feel at peace with this decision not to escalate care if he should decline, knowing this is what he wants. Wife speaks of completing living wills many years ago and confirms he would NOT want resuscitation or breathing machine.   We discussed focus on comfort and medications as needed for symptom management. If he declines through the weekend, they do not want him placed back on high flow--they want to allow dignity at the EOL.   Discussed hospice options. Daughter feels home is NOT an option because his wife is primary caregiver. Discussed eligibility for hospice facility. They agree with  watchful waiting through the weekend and further evaluation for hospice home on Monday.   Answered questions and concerns. Therapeutic listening and emotional/spiritual support provided.   Length of Stay: 3  Current Medications: Scheduled Meds:   Continuous Infusions:  PRN Meds: acetaminophen, bisacodyl, glycopyrrolate, LORazepam, morphine injection, morphine CONCENTRATE, ondansetron (ZOFRAN) IV  Physical Exam  Constitutional: He is easily aroused. He appears ill.  HENT:  Head: Normocephalic and atraumatic.  Cardiovascular: An irregularly irregular rhythm present.  Pulmonary/Chest: He has decreased breath sounds.  Shallow. On RA  Neurological: He is easily aroused.  Skin: Skin is warm and dry.  Psychiatric: His speech is delayed. He is  withdrawn. He is inattentive.  Nursing note and vitals reviewed.          Vital Signs: BP 128/76 (BP Location: Right Arm)   Pulse 84   Temp 98.4 F (36.9 C) (Oral)   Resp 20   Ht 5' 8"  (1.727 m)   Wt 82.2 kg (181 lb 3.5 oz)   SpO2 97%   BMI 27.55 kg/m  SpO2: SpO2: 97 % O2 Device: O2 Device: Nasal Cannula O2 Flow Rate: O2 Flow Rate (L/min): 2 L/min  Intake/output summary:   Intake/Output Summary (Last 24 hours) at 09/15/16 1539 Last data filed at 09/15/16 0600  Gross per 24 hour  Intake            234.9 ml  Output                0 ml  Net            234.9 ml   LBM: Last BM Date: 09/12/16 Baseline Weight: Weight: 79.4 kg (175 lb) Most recent weight: Weight: 82.2 kg (181 lb 3.5 oz)       Palliative Assessment/Data: PPS 30%   Flowsheet Rows     Most Recent Value  Intake Tab  Referral Department  Hospitalist  Unit at Time of Referral  ICU  Palliative Care Primary Diagnosis  Pulmonary  Date Notified  09/15/16  Palliative Care Type  New Palliative care  Reason for referral  Clarify Goals of Care  Date of Admission  09/12/16  Date first seen by Palliative Care  09/15/16  # of days Palliative referral response time  0 Day(s)    # of days IP prior to Palliative referral  3  Clinical Assessment  Palliative Performance Scale Score  30%  Psychosocial & Spiritual Assessment  Palliative Care Outcomes  Patient/Family meeting held?  Yes  Who was at the meeting?  wife and daughter  Palliative Care Outcomes  Clarified goals of care, Provided end of life care assistance, Provided psychosocial or spiritual support, ACP counseling assistance, Changed to focus on comfort, Counseled regarding hospice, Improved pain interventions, Improved non-pain symptom therapy      Patient Active Problem List   Diagnosis Date Noted  . Vascular dementia without behavioral disturbance   . Palliative care encounter   . Goals of care, counseling/discussion   . Encounter for hospice care discussion   . Sepsis ([place]) 09/12/2016    Palliative Care Assessment & Plan   Patient Profile: 83 y.o. male  with past medical history of CVA, CAD, Afib, COPD, and mild dementia who was admitted on 09/12/2016 with sepsis secondary to pneumonia and rapid afib. Since admission his lactic acid has remained high (4 range), and he is suffering with confusion/delirium.  He has been placed on high flow nasal canula (45L at 40%) for acute hypoxic resp failure secondary to pneumonia, COPD and a possible aspiration event.  Assessment: Sepsis Pneumonia Acute hypoxic respiratory failure COPD Atrial fibrillation Hx of dementia/TIA Risk for aspiration  Recommendations/Plan:  DNR/DNI.   Comfort measures. No escalation of care. If he should decline over the weekend, allow comfort and dignity at EOL.   Symptom management  Roxanol 69m SL q1h prn pain/dyspnea/air hunger  Morphine 25mIV q1h prn pain/dyspnea/air hunger  Ativan 101m29mV q4h prn anxiety  Robinul 0.2mg6101mh prn secretions  Dulcolax suppository daily prn constipation  Watchful waiting through the weekend to determine hospice eligibility. PMT will f/u Monday.   Goals of Care and Additional  Recommendations:  Limitations on Scope of Treatment: Full Comfort Care  Code Status: DNR   Code Status Orders        Start     Ordered   09/15/16 1254  DNR (Do not attempt resuscitation)  Continuous    Question Answer Comment  In the event of cardiac or respiratory ARREST Do not call a "code blue"   In the event of cardiac or respiratory ARREST Do not perform Intubation, CPR, defibrillation or ACLS   In the event of cardiac or respiratory ARREST Use medication by any route, position, wound care, and other measures to relive pain and suffering. May use oxygen, suction and manual treatment of airway obstruction as needed for comfort.      09/15/16 1253    Code Status History    Date Active Date Inactive Code Status Order ID Comments User Context   09/13/2016  1:29 PM 09/15/2016 12:53 PM DNR [id number]  Flora Lipps, MD Inpatient   09/13/2016 11:14 AM 09/13/2016  1:29 PM Partial Code [id number]  Flora Lipps, MD Inpatient   09/12/2016  3:49 PM 09/13/2016 11:14 AM DNR [id number]  Max Sane, MD Inpatient   09/12/2016 12:06 PM 09/12/2016  3:49 PM Full Code [id number]  Max Sane, MD Inpatient       Prognosis:   Unable to determine: guarded with acute respiratory failure secondary to pneumonia/COPD and with functional, cognitive, and nutritional status decline  Discharge Planning:  To Be Determined: home hospice services versus inpatient hospice facility  Care plan was discussed with patient, wife, daughter, Dr. Manuella Ghazi, RN  Thank you for allowing the Palliative Medicine Team to assist in the care of this patient.   Time In: 1907 Time Out: 1355 Total Time 96mn Prolonged Time Billed yes      Greater than 50%  of this time was spent counseling and coordinating care related to the above assessment and plan.  MIhor Dow FNP-C Palliative Medicine Team  Phone: [phone]: [phone] Please contact Palliative Medicine Team phone at [phone] questions and concerns.

## 2016-09-15 NOTE — Progress Notes (Signed)
Family Meeting Note  Advance Directive:yes  Today a meeting took place with the Patient, spouse and Daughter.  The following clinical team members were present during this meeting:MD  The following were discussed:Patient's diagnosis: , Patient's progosis: < 6 weeks and Goals for treatment: DNR  Additional follow-up to be provided: Patient and family chose comfort care and withdrawal of active treatment.  They would like to go hospice home if he survives.  He may die here.  Time spent during discussion:20 minutes  Max Sane, MD

## 2016-09-15 NOTE — Progress Notes (Signed)
[place] [place] NAME: Steven Medina    MR#:  [id number]  DATE OF BIRTH:  12-01-1927  SUBJECTIVE:  CHIEF COMPLAINT:   Chief Complaint  Patient presents with  . Fall  Breathing got worse overnight and lactic acid trended up.  Requiring transfer back to ICU this morning Struggling to get phlegm out.  Has thick mucus REVIEW OF SYSTEMS:  Review of Systems  Constitutional: Positive for malaise/fatigue. Negative for chills, fever and weight loss.  HENT: Negative for nosebleeds and sore throat.   Eyes: Negative for blurred vision.  Respiratory: Positive for cough, sputum production and shortness of breath. Negative for wheezing.   Cardiovascular: Negative for chest pain, orthopnea, leg swelling and PND.  Gastrointestinal: Negative for abdominal pain, constipation, diarrhea, heartburn, nausea and vomiting.  Genitourinary: Negative for dysuria and urgency.  Musculoskeletal: Negative for back pain.  Skin: Negative for rash.  Neurological: Positive for weakness. Negative for dizziness, speech change, focal weakness and headaches.  Endo/Heme/Allergies: Does not bruise/bleed easily.  Psychiatric/Behavioral: Negative for depression.   DRUG ALLERGIES:  No Known Allergies VITALS:  Blood pressure 128/76, pulse 84, temperature 98.4 F (36.9 C), temperature source Oral, resp. rate 20, height 5\' 8"  (1.727 m), weight 82.2 kg (181 lb 3.5 oz), SpO2 97 %. PHYSICAL EXAMINATION:  Physical Exam  Constitutional: He is oriented to person, place, and time. He appears toxic. He has a sickly appearance. He appears distressed.  HENT:  Head: Normocephalic and atraumatic.  Eyes: Pupils are equal, round, and reactive to light. Conjunctivae and EOM are normal.  Neck: Normal range of motion. Neck supple. No tracheal deviation present. No thyromegaly present.  Cardiovascular: Normal rate, regular rhythm and normal heart sounds.   Pulmonary/Chest: Accessory muscle  usage present. Tachypnea noted. He is in respiratory distress. He has decreased breath sounds in the right lower field and the left lower field. He has no wheezes. He has rhonchi in the right lower field. He exhibits no tenderness.  Abdominal: Soft. Bowel sounds are normal. He exhibits no distension. There is no tenderness.  Musculoskeletal: Normal range of motion.  Neurological: He is alert and oriented to person, place, and time. No cranial nerve deficit.  Skin: Skin is warm and dry. No rash noted.  Psychiatric: Mood and affect normal.   LABORATORY PANEL:  Male CBC  Recent Labs Lab 09/13/16 0352  WBC 9.1  HGB 16.0  HCT 46.9  PLT 133*   ------------------------------------------------------------------------------------------------------------------ Chemistries   Recent Labs Lab 09/12/16 1246  09/14/16 0312  NA 133*  < > 135  K 4.1  < > 4.0  CL 99*  < > 101  CO2 22  < > 22  GLUCOSE 127*  < > 121*  BUN 16  < > 19  CREATININE 0.89  < > 0.88  CALCIUM 8.9  < > 8.7*  AST 23  --   --   ALT 19  --   --   ALKPHOS 53  --   --   BILITOT 1.1  --   --   < > = values in this interval not displayed. RADIOLOGY:  No results found. 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 Management plans discussed with the patient, family  (wife daughter and granddaughter at bedside) and they are in agreement.  CODE STATUS: DNR /comfort care  TOTAL TIME TAKING CARE OF THIS PATIENT: 45 minutes.   More than 50% of the time was spent in counseling/coordination of care: YES    Max Sane M.D on 09/15/2016 at 12:52 PM  Between 7am to 6pm - Pager - [phone]  After 6pm go to [email or web address] - Proofreader  [facility]  Office  [phone]  CC: Primary care physician; Leonel Ramsay, MD  Note: This dictation was prepared with Dragon dictation along with smaller phrase technology. Any transcriptional errors that result from this process are unintentional.

## 2016-09-15 NOTE — Progress Notes (Signed)
CH made a follow up visit. Pt has made a bit of progress and may be moved to a floor unit. (per family member) Pt appeared resting. Family is tired but strong. [place] noticed there is a good amount of support. CH is available for follow up as needed.    09/15/16 1000  Clinical Encounter Type  Visited With Patient;Patient and family together  Visit Type Follow-up  Consult/Referral To Chaplain  Spiritual Encounters  Spiritual Needs Emotional

## 2016-09-15 NOTE — Progress Notes (Signed)
Chaplin received an Order requisition to talk to patient About end of life transitions. Once Chaplin arrived the Patient was sleep. The nurse said the family had Left and they would page Chaplin if needed. The Patient had just arrived on that particular floor.

## 2016-09-15 NOTE — Progress Notes (Signed)
[place] INFECTIOUS DISEASE PROGRESS NOTE Date of Admission:  09/12/2016     ID: Steven Medina is a 83 y.o. male with PNA Active Problems:   Sepsis ([place])   Vascular dementia without behavioral disturbance   Palliative care encounter   Goals of care, counseling/discussion   Encounter for hospice care discussion   Subjective: Continued to decline.  Family chose comfort care  ROS  Unable to obtain  Medications:  Antibiotics Given (last 72 hours)    Date/Time Action Medication Dose Rate   09/13/16 0927 New Bag/Given   cefTRIAXone (ROCEPHIN) 1 g in dextrose 5 % 50 mL IVPB 1 g 100 mL/hr   09/13/16 1107 New Bag/Given   azithromycin (ZITHROMAX) 500 mg in dextrose 5 % 250 mL IVPB 500 mg 250 mL/hr   09/13/16 1714 New Bag/Given   piperacillin-tazobactam (ZOSYN) IVPB 3.375 g 3.375 g 12.5 mL/hr   09/13/16 2317 New Bag/Given   piperacillin-tazobactam (ZOSYN) IVPB 3.375 g 3.375 g 12.5 mL/hr   09/14/16 0741 New Bag/Given   piperacillin-tazobactam (ZOSYN) IVPB 3.375 g 3.375 g 12.5 mL/hr   09/14/16 1059 New Bag/Given   azithromycin (ZITHROMAX) 500 mg in dextrose 5 % 250 mL IVPB 500 mg 250 mL/hr   09/14/16 1748 New Bag/Given   piperacillin-tazobactam (ZOSYN) IVPB 3.375 g 3.375 g 12.5 mL/hr   09/15/16 0029 New Bag/Given   piperacillin-tazobactam (ZOSYN) IVPB 3.375 g 3.375 g 12.5 mL/hr   09/15/16 0817 New Bag/Given   piperacillin-tazobactam (ZOSYN) IVPB 3.375 g 3.375 g 12.5 mL/hr   09/15/16 1034 New Bag/Given   azithromycin (ZITHROMAX) 500 mg in dextrose 5 % 250 mL IVPB 500 mg 250 mL/hr       Objective: Vital signs in last 24 hours: Temp:  [97.9 F (36.6 C)-98.5 F (36.9 C)] 98.4 F (36.9 C) (07/13 1227) Pulse Rate:  [70-103] 84 (07/13 1227) Resp:  [16-43] 20 (07/13 1227) BP: (76-138)/(49-84) 128/76 (07/13 1227) SpO2:  [91 %-99 %] 97 % (07/13 1331) FiO2 (%):  [28 %-40 %] 28 % (07/13 1331) Constitutional: He is faril, somewhat confused HENT: anicteric Mouth/Throat:  Oropharynx is clear and moist. No oropharyngeal exudate.  Cardiovascular: Tachy,   Pulmonary/Chest: poor air movement Abdominal: Soft. Bowel sounds are normal. He exhibits no distension. There is no tenderness.  Lymphadenopathy:  He has no cervical adenopathy.  Neurological: He is awake but somewhat confused Skin: Skin is warm and dry. No rash noted. No erythema.  Psychiatric: He has a normal mood and affect. His behavior is normal.   Lab Results  Recent Labs  09/13/16 0352 09/14/16 0312  WBC 9.1  --   HGB 16.0  --   HCT 46.9  --   NA 138 135  K 3.9 4.0  CL 104 101  CO2 22 22  BUN 13 19  CREATININE 0.97 0.88    Microbiology: Results for orders placed or performed during the hospital encounter of 09/12/16  Culture, blood (Routine x 2)     Status: None (Preliminary result)   Collection Time: 09/12/16  6:28 AM  Result Value Ref Range Status   Specimen Description BLOOD  LEFT FOREARM   Final   Special Requests   Final    BOTTLES DRAWN AEROBIC AND ANAEROBIC Blood Culture adequate volume   Culture NO GROWTH 3 DAYS  Final   Report Status PENDING  Incomplete  Culture, blood (Routine x 2)     Status: None (Preliminary result)   Collection Time: 09/12/16  6:28 AM  Result Value Ref Range  Status   Specimen Description BLOOD  LEFT AC  Final   Special Requests   Final    BOTTLES DRAWN AEROBIC AND ANAEROBIC Blood Culture results may not be optimal due to an excessive volume of blood received in culture bottles   Culture NO GROWTH 3 DAYS  Final   Report Status PENDING  Incomplete  Urine culture     Status: Abnormal   Collection Time: 09/12/16  7:17 AM  Result Value Ref Range Status   Specimen Description URINE, RANDOM  Final   Special Requests NONE  Final   Culture (A)  Final    <10,000 COLONIES/mL INSIGNIFICANT GROWTH Performed at [facility], [address] [place], [place] [address]    Report Status 09/13/2016 FINAL  Final  Culture, blood (x 2)     Status: None  (Preliminary result)   Collection Time: 09/12/16 12:30 PM  Result Value Ref Range Status   Specimen Description BLOOD RIGHT HAND  Final   Special Requests   Final    BOTTLES DRAWN AEROBIC AND ANAEROBIC Blood Culture adequate volume   Culture NO GROWTH 3 DAYS  Final   Report Status PENDING  Incomplete  MRSA PCR Screening     Status: None   Collection Time: 09/12/16 12:31 PM  Result Value Ref Range Status   MRSA by PCR NEGATIVE NEGATIVE Final    Comment:        The GeneXpert MRSA Assay (FDA approved for NASAL specimens only), is one component of a comprehensive MRSA colonization surveillance program. It is not intended to diagnose MRSA infection nor to guide or monitor treatment for MRSA infections.   Culture, blood (x 2)     Status: None (Preliminary result)   Collection Time: 09/12/16 12:46 PM  Result Value Ref Range Status   Specimen Description BLOOD  LEFT HAND   Final   Special Requests   Final    BOTTLES DRAWN AEROBIC AND ANAEROBIC Blood Culture adequate volume   Culture NO GROWTH 3 DAYS  Final   Report Status PENDING  Incomplete    Studies/Results: No results found.  Assessment/Plan: Steven Medina is a 83 y.o. male admitted with RLL pna following a cough for 1 week. Family denies any aspiration events but some confusion.  He was improving but then worsened and is on Hi Flow o2. Intermittent confusion. CXR worsening. I suspect aspiration.   Recommendations Continue  zosyn for aspiration coverage No need for vanco since MRSA PCR neg Cont azithro x 5 days follow clinically. He is DNR. Discussed with his wife and daughter. I am also his PCP.  Thank you very much for the consult. Will follow with you.  Yamira Papa P   09/15/2016, 3:53 PM

## 2016-09-15 NOTE — Progress Notes (Signed)
Much improved. Comfortable on [place] O2. Discussed with Dr Manuella Ghazi. Transfer order placed. PCCM will sign off. Please call if we can be of further assistance.   Merton Border, MD PCCM service Mobile [phone] Pager [phone] 09/15/2016 3:21 PM

## 2016-09-15 NOTE — Progress Notes (Signed)
CH made a follow up visit with the family in the Family Room on 1-C. Pt made the decision to be taken off all of his medications. The wife informed the MD of this request. The wife feels some guilt. [place] reminded her that this is the desire and decision of the Pt. The wife is resolved to face this one day, one step, at a time. CH provided the ministry of empathetic listening and prayer. CH is available for follow up as needed.    09/15/16 1400  Clinical Encounter Type  Visited With Family  Visit Type Follow-up;Social support  Consult/Referral To Psychologist, educational  Spiritual Needs Prayer;Emotional

## 2016-09-16 NOTE — Clinical Social Work Note (Signed)
CSW aware that patient is comfort care. Should the family request hospice over the weekend, the CSW will facilitate. According to the palliative care consult note, the family would prefer to assess for hospice on Monday.   Santiago Bumpers, MSW, Latanya Presser [phone]

## 2016-09-16 NOTE — Progress Notes (Signed)
Steven Medina at [place] NAME: Steven Medina    MR#:  [id number]  DATE OF BIRTH:  01-21-1928  SUBJECTIVE:  CHIEF COMPLAINT:   Chief Complaint  Patient presents with  . Fall    Patient currently on room air and comfortable is asymptomatic multiple family members at bedside He is confused    REVIEW OF SYSTEMS:  Review of Systems  Constitutional: Positive for malaise/fatigue. Negative for chills, fever and weight loss.  HENT: Negative for nosebleeds and sore throat.   Eyes: Negative for blurred vision.  Respiratory: Positive for cough, sputum production and shortness of breath. Negative for wheezing.   Cardiovascular: Negative for chest pain, orthopnea, leg swelling and PND.  Gastrointestinal: Negative for abdominal pain, constipation, diarrhea, heartburn, nausea and vomiting.  Genitourinary: Negative for dysuria and urgency.  Musculoskeletal: Negative for back pain.  Skin: Negative for rash.  Neurological: Positive for weakness. Negative for dizziness, speech change, focal weakness and headaches.  Endo/Heme/Allergies: Does not bruise/bleed easily.  Psychiatric/Behavioral: Negative for depression.   DRUG ALLERGIES:  No Known Allergies VITALS:  Blood pressure (!) 144/96, pulse 82, temperature 98.2 F (36.8 C), temperature source Oral, resp. rate (!) 21, height 5\' 8"  (1.727 m), weight 181 lb 3.5 oz (82.2 kg), SpO2 95 %. PHYSICAL EXAMINATION:  Physical Exam  Constitutional: He is oriented to person, place, and time. He appears toxic. He has a sickly appearance. No distress.  HENT:  Head: Normocephalic and atraumatic.  Eyes: Pupils are equal, round, and reactive to light. Conjunctivae and EOM are normal.  Neck: Normal range of motion. Neck supple. No tracheal deviation present. No thyromegaly present.  Cardiovascular: Normal rate, regular rhythm and normal heart sounds.   Pulmonary/Chest: No accessory muscle usage. No tachypnea. No  respiratory distress. He has decreased breath sounds in the right lower field and the left lower field. He has no wheezes. He has no rhonchi. He exhibits no tenderness.  Abdominal: Soft. Bowel sounds are normal. He exhibits no distension. There is no tenderness.  Musculoskeletal: Normal range of motion.  Neurological: He is alert and oriented to person, place, and time. No cranial nerve deficit.  Skin: Skin is warm and dry. No rash noted.  Psychiatric: Mood and affect normal.   LABORATORY PANEL:  Male CBC  Recent Labs Lab 09/13/16 0352  WBC 9.1  HGB 16.0  HCT 46.9  PLT 133*   ------------------------------------------------------------------------------------------------------------------ Chemistries   Recent Labs Lab 09/12/16 1246  09/14/16 0312  NA 133*  < > 135  K 4.1  < > 4.0  CL 99*  < > 101  CO2 22  < > 22  GLUCOSE 127*  < > 121*  BUN 16  < > 19  CREATININE 0.89  < > 0.88  CALCIUM 8.9  < > 8.7*  AST 23  --   --   ALT 19  --   --   ALKPHOS 53  --   --   BILITOT 1.1  --   --   < > = values in this interval not displayed. RADIOLOGY:  No results found. ASSESSMENT AND PLAN:  83 year old male with a known history of Atrial fibrillation, coronary artery disease, hypertension is being admitted for possible sepsis due to pneumonia  * Sepsis: Present on admission - Likely source of infection - lung, Pneumonia, Aspiration  -Patient and family chose comfort care/hospice -Currently not on any antibiotics,  Patient appears to be stable at this point we'll continue to  monitor not certain if he would be a hospice home appropriate may need to go home with hospice   * Community acquired pneumonia -Patient and family chose comfort care/hospice  * COPD exacerbation - Due to comfort care measures not being treated currently stable  * Rapid atrial fibrillation  * Acute hypoxic respiratory failure  * Hypertension      All the records are reviewed and case  discussed with Care Management/Social Worker. Management plans discussed with the patient, family (wife daughter and granddaughter at bedside) and they are in agreement.  CODE STATUS: DNR /comfort care  TOTAL TIME TAKING CARE OF THIS PATIENT: 32 minutes.   More than 50% of the time was spent in counseling/coordination of care: Steven Medina M.D on 09/16/2016 at 12:41 PM  Between 7am to 6pm - Pager - [phone]  After 6pm go to [email or web address] - Proofreader  [facility]  Office  [phone]  CC: Primary care physician; Steven Ramsay, MD  Note: This dictation was prepared with Dragon dictation along with smaller phrase technology. Any transcriptional errors that result from this process are unintentional.

## 2016-09-17 LAB — CULTURE, BLOOD (ROUTINE X 2)
CULTURE: NO GROWTH
Culture: NO GROWTH
Culture: NO GROWTH
Culture: NO GROWTH
SPECIAL REQUESTS: ADEQUATE
SPECIAL REQUESTS: ADEQUATE
SPECIAL REQUESTS: ADEQUATE

## 2016-09-17 NOTE — Progress Notes (Signed)
[place] at [place] NAME: Steven Medina    MR#:  [id number]  DATE OF BIRTH:  07/30/1927  SUBJECTIVE:  CHIEF COMPLAINT:   Chief Complaint  Patient presents with  . Fall   Patient unresponsive currently comfortable wife at bedside   REVIEW OF SYSTEMS:  Review of Systems  Unable to perform ROS: Mental status change   DRUG ALLERGIES:  No Known Allergies VITALS:  Blood pressure 135/82, pulse (!) 110, temperature 98.2 F (36.8 C), temperature source Axillary, resp. rate (!) 23, height 5\' 8"  (1.727 m), weight 181 lb 3.5 oz (82.2 kg), SpO2 97 %. PHYSICAL EXAMINATION:  Physical Exam  Constitutional: He is oriented to person, place, and time. He appears toxic. He has a sickly appearance. No distress.  HENT:  Head: Normocephalic and atraumatic.  Eyes: Pupils are equal, round, and reactive to light. Conjunctivae and EOM are normal.  Neck: Normal range of motion. Neck supple. No tracheal deviation present. No thyromegaly present.  Cardiovascular: Normal rate, regular rhythm and normal heart sounds.   Pulmonary/Chest: No accessory muscle usage. No tachypnea. No respiratory distress. He has decreased breath sounds in the right lower field and the left lower field. He has no wheezes. He has no rhonchi. He exhibits no tenderness.  Abdominal: Soft. Bowel sounds are normal. He exhibits no distension. There is no tenderness.  Musculoskeletal: Normal range of motion.  Neurological: He is alert and oriented to person, place, and time. No cranial nerve deficit.  Skin: Skin is warm and dry. No rash noted.  Psychiatric: Mood and affect normal.   LABORATORY PANEL:  Male CBC  Recent Labs Lab 09/13/16 0352  WBC 9.1  HGB 16.0  HCT 46.9  PLT 133*   ------------------------------------------------------------------------------------------------------------------ Chemistries   Recent Labs Lab 09/12/16 1246  09/14/16 0312  NA 133*  < > 135  K  4.1  < > 4.0  CL 99*  < > 101  CO2 22  < > 22  GLUCOSE 127*  < > 121*  BUN 16  < > 19  CREATININE 0.89  < > 0.88  CALCIUM 8.9  < > 8.7*  AST 23  --   --   ALT 19  --   --   ALKPHOS 53  --   --   BILITOT 1.1  --   --   < > = values in this interval not displayed. RADIOLOGY:  No results found. ASSESSMENT AND PLAN:  83 year old male with a known history of Atrial fibrillation, coronary artery disease, hypertension is being admitted for possible sepsis due to pneumonia  * Sepsis: Present on admission - Likely source of infection - lung, Pneumonia, Aspiration  -Patient and family chose comfort care/hospice -Currently not on any antibiotics Patient appears to be stable at this point we'll continue to monitor not certain if he would be a hospice home appropriate may need to go home with hospice   * Community acquired pneumonia -Patient and family chose comfort care/hospice  * COPD exacerbation - Due to comfort care measures not being treated currently stable  * Rapid atrial fibrillation  * Acute hypoxic respiratory failure  * Hypertension   If no improvement by tomorrow consider hospice home   All the records are reviewed and case discussed with Care Management/Social Worker. Management plans discussed with the patient, family (wife daughter and granddaughter at bedside) and they are in agreement.  CODE STATUS: DNR /comfort care  TOTAL TIME TAKING CARE OF  THIS PATIENT: 32 minutes.   More than 50% of the time was spent in counseling/coordination of care: Tawni Levy M.D on 09/17/2016 at 12:17 PM  Between 7am to 6pm - Pager - [phone]  After 6pm go to [email or web address] - Proofreader  [facility]  Office  [phone]  CC: Primary care physician; Leonel Ramsay, MD  Note: This dictation was prepared with Dragon dictation along with smaller phrase technology. Any transcriptional errors that result from this  process are unintentional.

## 2016-09-18 MED ORDER — GLYCOPYRROLATE 1 MG PO TABS
1.0000 mg | ORAL_TABLET | ORAL | Status: DC | PRN
  Filled 2016-09-18: qty 1

## 2016-09-18 MED ORDER — LORAZEPAM 2 MG/ML PO CONC: 1.0000 mg | ORAL | Status: DC | PRN

## 2016-09-18 MED ORDER — HALOPERIDOL 0.5 MG PO TABS
0.5000 mg | ORAL_TABLET | ORAL | Status: DC | PRN
  Filled 2016-09-18: qty 1

## 2016-09-18 MED ORDER — HALOPERIDOL LACTATE 2 MG/ML PO CONC
0.5000 mg | ORAL | Status: DC | PRN
  Filled 2016-09-18: qty 0.3

## 2016-09-18 MED ORDER — MORPHINE SULFATE (CONCENTRATE) 10 MG/0.5ML PO SOLN: 5.0000 mg | ORAL | Status: AC | PRN

## 2016-09-18 MED ORDER — LORAZEPAM 1 MG PO TABS: 1.0000 mg | ORAL_TABLET | ORAL | 0 refills | Status: AC | PRN

## 2016-09-18 MED ORDER — LORAZEPAM 2 MG/ML IJ SOLN: 1.0000 mg | INTRAMUSCULAR | Status: DC | PRN

## 2016-09-18 MED ORDER — BIOTENE DRY MOUTH MT LIQD: 15.0000 mL | OROMUCOSAL | Status: DC | PRN

## 2016-09-18 MED ORDER — GLYCOPYRROLATE 0.2 MG/ML IJ SOLN
0.2000 mg | INTRAMUSCULAR | Status: DC | PRN
  Filled 2016-09-18: qty 1

## 2016-09-18 MED ORDER — POLYVINYL ALCOHOL 1.4 % OP SOLN
1.0000 [drp] | Freq: Four times a day (QID) | OPHTHALMIC | Status: DC | PRN
  Filled 2016-09-18: qty 15

## 2016-09-18 MED ORDER — LORAZEPAM 1 MG PO TABS: 1.0000 mg | ORAL_TABLET | ORAL | Status: DC | PRN

## 2016-09-18 MED ORDER — HALOPERIDOL LACTATE 5 MG/ML IJ SOLN: 0.5000 mg | INTRAMUSCULAR | Status: DC | PRN

## 2016-09-18 NOTE — Care Management (Signed)
This RNCM has updated [facility] of patient disposition as they were following patient for home health. Patient is now comfort care.

## 2016-09-18 NOTE — Clinical Social Work Note (Signed)
Patient to be d/c'ed today to [facility].  Patient and family agreeable to plans will transport via ems hospice home nurse liason RN to call report.  Evette Cristal, MSW, [place]

## 2016-09-18 NOTE — Progress Notes (Signed)
New hospice home referral received from [facility] following a Palliative Medicine follow visit. Mr. Puccinelli is an 83 year old man with past medical history of CVA, CAD, Afib, COPD, and mild dementia who was admitted on 09/12/2016 with sepsis secondary to pneumonia and rapid afib. He required [place] oxygen for acute respiratory failure secondary to pneumonia, COPD and possible aspiration. He and his family declined intubation. He has continued with episodes of delirium and is only eating bites and sips, sleeping most of the time. Pallaitive Medicine PA Florentina Jenny Met again with his family this morning and they have chosen to focus on his comfort with transfer to the hospice home today. Writer met in the family room with Mrs. Becker and her daughter in law Jeani Hawking to initiate education regarding hospice services, philosophy and team approach to care with good understanding voiced. Mrs. Copes reiterated several times the conversations she and her husband have had over the years regarding  What his wishes were", he "never wanted a feeding to be or to be hooked up to machines". Emotional support offered, questions answered, consents signed. Patient information faxed to referral, report called to hospice home, EMS notified for transport. Hospital care team all aware. Thank you. Flo Shanks RN, BSN, [place] [place] and [facility] [place], [facility] [phone] c

## 2016-09-18 NOTE — Progress Notes (Signed)
Pt is being discharged today, IV x2 were removed, all belongings packed and returned to him. He was transported via EMS.

## 2016-09-18 NOTE — Discharge Instructions (Signed)
[place] at [place]:  As tolerates  DISCHARGE CONDITION:  Stable  ACTIVITY:  Activity as tolerated  OXYGEN:  Home Oxygen: No.   Oxygen Delivery: room air  DISCHARGE LOCATION:  hosices home    ADDITIONAL DISCHARGE INSTRUCTION:   If you experience worsening of your admission symptoms, develop shortness of breath, life threatening emergency, suicidal or homicidal thoughts you must seek medical attention immediately by calling 911 or calling your MD immediately  if symptoms less severe.  You Must read complete instructions/literature along with all the possible adverse reactions/side effects for all the Medicines you take and that have been prescribed to you. Take any new Medicines after you have completely understood and accpet all the possible adverse reactions/side effects.   Please note  You were cared for by a hospitalist during your hospital stay. If you have any questions about your discharge medications or the care you received while you were in the hospital after you are discharged, you can call the unit and asked to speak with the hospitalist on call if the hospitalist that took care of you is not available. Once you are discharged, your primary care physician will handle any further medical issues. Please note that NO REFILLS for any discharge medications will be authorized once you are discharged, as it is imperative that you return to your primary care physician (or establish a relationship with a primary care physician if you do not have one) for your aftercare needs so that they can reassess your need for medications and monitor your lab values.

## 2016-09-18 NOTE — Progress Notes (Signed)
Daily Progress Note   Patient Name: Steven Medina       Date: 09/18/2016 DOB: 13-Aug-1927  Age: 83 y.o. MRN#: [id number] Attending Physician: Dustin Flock, MD Primary Care Physician: Leonel Ramsay, MD Admit Date: 09/12/2016  Reason for Consultation/Follow-up: Establishing goals of care  Subjective: Patient states "I want to get out of here today".  Spoke with family at length.  Patient has been having episodes of delirium.  He thought his daughter was stealing from him and his son in law was going to kill him.  Spends extended periods of time sleeping.  Then will be awake and talking for a 10 hours straight.  Has no appetite or thirst. He will take sips and bites only with encouragement from family.  He has been having paroxysmal coughing spells with significant amounts of mucous coming up.    His wife (52 yo) is concerned about his EOL care.  We discussed hospice house.  She is agreeable - her daughter is also agreeable.   Assessment: 83 yo male underlying dementia, severe COPD with resp failure bed bound, not eating/drinking, episodes of clarity followed by delirium.  Now off oxygen.  Goals are comfort care.   Patient Profile/HPI: 83 y.o. male  with past medical history of CVA, CAD, Afib, COPD, and mild dementia who was admitted on 09/12/2016 with sepsis secondary to pneumonia and rapid afib. Since admission his lactic acid has remained high (4 range), and he is suffering with confusion/delirium.  He has been placed on high flow nasal canula (45L at 40%) for acute hypoxic resp failure secondary to pneumonia, COPD and a possible aspiration event.   Length of Stay: 6  Current Medications: Scheduled Meds:    Continuous Infusions:   PRN Meds: acetaminophen, antiseptic oral  rinse, bisacodyl, glycopyrrolate **OR** glycopyrrolate **OR** glycopyrrolate, haloperidol **OR** haloperidol **OR** haloperidol lactate, LORazepam **OR** LORazepam **OR** LORazepam, morphine injection, morphine CONCENTRATE, ondansetron (ZOFRAN) IV, polyvinyl alcohol  Physical Exam       2 of 6   Vital Signs: BP (!) 141/84 (BP Location: Left Arm)   Pulse 90   Temp 98.6 F (37 C) (Axillary)   Resp 20   Ht 5\' 8"  (1.727 m)   Wt 82.2 kg (181 lb 3.5 oz)   SpO2 95%   BMI 27.55 kg/m  SpO2: SpO2: 95 %  O2 Device: O2 Device: Not Delivered O2 Flow Rate: O2 Flow Rate (L/min): 2 L/min  Intake/output summary:   Intake/Output Summary (Last 24 hours) at 09/18/16 1046 Last data filed at 09/18/16 0857  Gross per 24 hour  Intake                0 ml  Output              250 ml  Net             -250 ml   LBM: Last BM Date: 09/16/16 Baseline Weight: Weight: 79.4 kg (175 lb) Most recent weight: Weight: 82.2 kg (181 lb 3.5 oz)       Palliative Assessment/Data:    Flowsheet Rows     Most Recent Value  Intake Tab  Referral Department  Hospitalist  Unit at Time of Referral  ICU  Palliative Care Primary Diagnosis  Pulmonary  Date Notified  09/15/16  Palliative Care Type  New Palliative care  Reason for referral  Clarify Goals of Care  Date of Admission  09/12/16  Date first seen by Palliative Care  09/15/16  # of days Palliative referral response time  0 Day(s)  # of days IP prior to Palliative referral  3  Clinical Assessment  Palliative Performance Scale Score  30%  Psychosocial & Spiritual Assessment  Palliative Care Outcomes  Patient/Family meeting held?  Yes  Who was at the meeting?  wife and daughter  Palliative Care Outcomes  Clarified goals of care, Provided end of life care assistance, Provided psychosocial or spiritual support, ACP counseling assistance, Changed to focus on comfort, Counseled regarding hospice, Improved pain interventions, Improved non-pain symptom therapy       Patient Active Problem List   Diagnosis Date Noted  . Community acquired pneumonia of right lower lobe of lung ([place])   . Vascular dementia without behavioral disturbance   . Palliative care encounter   . Goals of care, counseling/discussion   . Encounter for hospice care discussion   . Sepsis ([place]) 09/12/2016    Palliative Care Plan    Recommendations/Plan: Full comfort care.  Add haldol for delirium PRN Add morphine for dyspnea or pain PRN Add ativan for anxiety PRN Add robinul for secretions.  Goals of Care and Additional Recommendations:  Limitations on Scope of Treatment: Full Comfort Care  Code Status:  DNR  Prognosis:  Days to weeks based on acute on chronic respiratory failure, minimal PO intake, bedbound, delirium  Discharge Planning:  Hospice facility  Care plan was discussed with family, social work, attending, Hospice.  Thank you for allowing the Palliative Medicine Team to assist in the care of this patient.  Total time spent:  35 min.     Greater than 50%  of this time was spent counseling and coordinating care related to the above assessment and plan.  Florentina Jenny, PA-C Palliative Medicine  Please contact Palliative MedicineTeam phone at [phone] for questions and concerns between 7 am - 7 pm.   Please see AMION for individual provider pager numbers.

## 2016-09-18 NOTE — Plan of Care (Signed)
Problem: Health Behavior/Discharge Planning: Goal: Ability to manage health-related needs will improve Outcome: Not Progressing Pt is comfort care

## 2016-09-18 NOTE — Discharge Summary (Signed)
[place] at [facility], 83 y.o., DOB [birth year], MRN [id number]. Admission date: 09/12/2016 Discharge Date 09/18/2016 Primary MD Leonel Ramsay, MD Admitting Physician Max Sane, MD  Admission Diagnosis  Sepsis, due to unspecified organism [facility]) [A41.9] Community acquired pneumonia of right lower lobe of lung ([place]) [J18.1]  Discharge Diagnosis   Active Problems:   Sepsis ([place]) due to pneumonia   Aspiration pneumonia Acute on chronic COPD exasperation Rapid atrial fibrillation Acute hypoxic respiratory failure Essential hypertension   Vascular dementia without behavioral disturbance   Palliative care encounter   Goals of care, counseling/discussion   Encounter for hospice care discussion   Community acquired pneumonia of right lower lobe of lung [facility])         Hospital Course  Patient is a 83 year old white male who was brought to the emergency room after he became unsteady on his feet and was unable to get up from the floor. Patient was seen in the emergency room and was admitted with sepsis. It was felt that likely source was pneumonia. Aspiration versus community-acquired pneumonia. He was treated with IV antibiotics. He needed a ICU stay due to requiring high flow nasal cannula. Further discussions were held he was made DO NOT RESUSCITATE. Due to patient's waxing and waning condition comfort care was initiated. Now patient is arranged to go to hospice home.           Consults  pulmonary/intensive care  Significant Tests:  See full reports for all details     Dg Chest 2 View  Result Date: 09/12/2016 CLINICAL DATA:  Fall, pneumonia, possible sepsis EXAM: CHEST  2 VIEW COMPARISON:  07/28/2009 chest radiograph. FINDINGS: Stable cardiomediastinal silhouette with top-normal heart size and aortic atherosclerosis. No pneumothorax. No pleural effusion. Patchy consolidation at the right lung base. No pulmonary edema. Healed  deformities in the lateral right ribs. Deformity in the right scapular body appears healed. IMPRESSION: Patchy right lung base consolidation compatible with pneumonia. Follow-up chest imaging to resolution recommended. Electronically Signed   By: Ilona Sorrel M.D.   On: 09/12/2016 07:34   Dg Chest Port 1 View  Result Date: 09/13/2016 CLINICAL DATA:  Shortness of breath. EXAM: PORTABLE CHEST 1 VIEW COMPARISON:  09/12/2016 and prior chest radiographs. FINDINGS: The cardiac silhouette is enlarged. Mediastinal contours appear intact. Calcific atherosclerotic disease of the aorta. There is no evidence of pneumothorax. Patchy airspace consolidation with peripheral predominance are seen in bilateral lower lungs and right mid lung field. Biapical pleural thickening. Probably chronic fractures of several right-sided ribs with subsequent posttraumatic concave right-sided chest deformity. Soft tissues are grossly normal. IMPRESSION: Bilateral lower lower lobes and right middle lobe patchy airspace consolidation. This may represent multifocal pneumonia, possibly aspiration pneumonia. Please follow-up to resolution to exclude underlying pulmonary masses. Enlarged cardiac silhouette and calcific atherosclerotic disease of the aorta. Electronically Signed   By: Fidela Salisbury M.D.   On: 09/13/2016 09:01       Today   Subjective:   Steven Medina  Pt infusions currently eating  Objective:   Blood pressure (!) 141/84, pulse 90, temperature 98.6 F (37 C), temperature source Axillary, resp. rate 20, height 5\' 8"  (1.727 m), weight 181 lb 3.5 oz (82.2 kg), SpO2 95 %.  .  Intake/Output Summary (Last 24 hours) at 09/18/16 1248 Last data filed at 09/18/16 0857  Gross per 24 hour  Intake                0 ml  Output              250 ml  Net             -250 ml    Exam VITAL SIGNS: Blood pressure (!) 141/84, pulse 90, temperature 98.6 F (37 C), temperature source Axillary, resp. rate 20, height 5\' 8"   (1.727 m), weight 181 lb 3.5 oz (82.2 kg), SpO2 95 %.  GENERAL:  83 y.o.-year-old patient lying in the bed Chronically ill appearing EYES: Pupils equal, round, reactive to light and accommodation. No scleral icterus. Extraocular muscles intact.  HEENT: Head atraumatic, normocephalic. Oropharynx and nasopharynx clear.  NECK:  Supple, no jugular venous distention. No thyroid enlargement, no tenderness.  LUNGS: Normal breath sounds bilaterally, no wheezing, rales,rhonchi or crepitation. No use of accessory muscles of respiration.  CARDIOVASCULAR: S1, S2 normal. No murmurs, rubs, or gallops.  ABDOMEN: Soft, nontender, nondistended. Bowel sounds present. No organomegaly or mass.  EXTREMITIES: No pedal edema, cyanosis, or clubbing.  NEUROLOGIC: Cranial nerves II through XII are intact. Muscle strength 5/5 in all extremities. Sensation intact. Gait not checked.  PSYCHIATRIC: The patient is alert and oriented x 3.  SKIN: No obvious rash, lesion, or ulcer.   Data Review     CBC w Diff: Lab Results  Component Value Date   WBC 9.1 09/13/2016   HGB 16.0 09/13/2016   HCT 46.9 09/13/2016   PLT 133 (L) 09/13/2016   LYMPHOPCT 6 09/12/2016   MONOPCT 3 09/12/2016   EOSPCT 0 09/12/2016   BASOPCT 0 09/12/2016   CMP: Lab Results  Component Value Date   NA 135 09/14/2016   K 4.0 09/14/2016   CL 101 09/14/2016   CO2 22 09/14/2016   BUN 19 09/14/2016   CREATININE 0.88 09/14/2016   PROT 7.5 09/12/2016   ALBUMIN 3.9 09/12/2016   BILITOT 1.1 09/12/2016   ALKPHOS 53 09/12/2016   AST 23 09/12/2016   ALT 19 09/12/2016  .  Micro Results Recent Results (from the past 240 hour(s))  Culture, blood (Routine x 2)     Status: None   Collection Time: 09/12/16  6:28 AM  Result Value Ref Range Status   Specimen Description BLOOD  LEFT FOREARM   Final   Special Requests   Final    BOTTLES DRAWN AEROBIC AND ANAEROBIC Blood Culture adequate volume   Culture NO GROWTH 5 DAYS  Final   Report Status  09/17/2016 FINAL  Final  Culture, blood (Routine x 2)     Status: None   Collection Time: 09/12/16  6:28 AM  Result Value Ref Range Status   Specimen Description BLOOD  LEFT AC  Final   Special Requests   Final    BOTTLES DRAWN AEROBIC AND ANAEROBIC Blood Culture results may not be optimal due to an excessive volume of blood received in culture bottles   Culture NO GROWTH 5 DAYS  Final   Report Status 09/17/2016 FINAL  Final  Urine culture     Status: Abnormal   Collection Time: 09/12/16  7:17 AM  Result Value Ref Range Status   Specimen Description URINE, RANDOM  Final   Special Requests NONE  Final   Culture (A)  Final    <10,000 COLONIES/mL INSIGNIFICANT GROWTH Performed at [facility], [address], [place], [place] [address]    Report Status 09/13/2016 FINAL  Final  Culture, blood (x 2)     Status: None   Collection Time: 09/12/16 12:30 PM  Result Value  Ref Range Status   Specimen Description BLOOD RIGHT HAND  Final   Special Requests   Final    BOTTLES DRAWN AEROBIC AND ANAEROBIC Blood Culture adequate volume   Culture NO GROWTH 5 DAYS  Final   Report Status 09/17/2016 FINAL  Final  MRSA PCR Screening     Status: None   Collection Time: 09/12/16 12:31 PM  Result Value Ref Range Status   MRSA by PCR NEGATIVE NEGATIVE Final    Comment:        The GeneXpert MRSA Assay (FDA approved for NASAL specimens only), is one component of a comprehensive MRSA colonization surveillance program. It is not intended to diagnose MRSA infection nor to guide or monitor treatment for MRSA infections.   Culture, blood (x 2)     Status: None   Collection Time: 09/12/16 12:46 PM  Result Value Ref Range Status   Specimen Description BLOOD  LEFT HAND   Final   Special Requests   Final    BOTTLES DRAWN AEROBIC AND ANAEROBIC Blood Culture adequate volume   Culture NO GROWTH 5 DAYS  Final   Report Status 09/17/2016 FINAL  Final        Code Status Orders        Start      Ordered   09/18/16 1044  Do not attempt resuscitation (DNR)  Continuous    Question Answer Comment  In the event of cardiac or respiratory ARREST Do not call a "code blue"   In the event of cardiac or respiratory ARREST Do not perform Intubation, CPR, defibrillation or ACLS   In the event of cardiac or respiratory ARREST Use medication by any route, position, wound care, and other measures to relive pain and suffering. May use oxygen, suction and manual treatment of airway obstruction as needed for comfort.      09/18/16 1044    Code Status History    Date Active Date Inactive Code Status Order ID Comments User Context   09/15/2016 12:53 PM 09/18/2016 10:44 AM DNR [id number]  Max Sane, MD Inpatient   09/13/2016  1:29 PM 09/15/2016 12:53 PM DNR [id number]  Flora Lipps, MD Inpatient   09/13/2016 11:14 AM 09/13/2016  1:29 PM Partial Code [id number]  Flora Lipps, MD Inpatient   09/12/2016  3:49 PM 09/13/2016 11:14 AM DNR [id number]  Max Sane, MD Inpatient   09/12/2016 12:06 PM 09/12/2016  3:49 PM Full Code [id number]  Max Sane, MD Inpatient    Advance Directive Documentation     Most Recent Value  Type of Advance Directive  Healthcare Power of Attorney, Living will  Pre-existing out of facility DNR order (yellow form or pink MOST form)  -  "MOST" Form in Place?  -            Discharge Medications   Allergies as of 09/18/2016   No Known Allergies     Medication List    STOP taking these medications   amLODipine 5 MG tablet Commonly known as:  NORVASC   benzonatate 100 MG capsule Commonly known as:  TESSALON   FISH OIL PO   latanoprost 0.005 % ophthalmic solution Commonly known as:  XALATAN   lisinopril 5 MG tablet Commonly known as:  PRINIVIL,ZESTRIL   metoprolol tartrate 25 MG tablet Commonly known as:  LOPRESSOR   potassium chloride 10 MEQ CR capsule Commonly known as:  MICRO-K   rivaroxaban 20 MG Tabs tablet Commonly known as:  XARELTO   timolol 0.5 %  ophthalmic  solution Commonly known as:  TIMOPTIC     TAKE these medications   LORazepam 1 MG tablet Commonly known as:  ATIVAN Take 1 tablet (1 mg total) by mouth every 4 (four) hours as needed for anxiety.   morphine CONCENTRATE 10 MG/0.5ML Soln concentrated solution Place 0.25 mLs (5 mg total) under the tongue every hour as needed for moderate pain, severe pain or shortness of breath (air hunger).          Total Time in preparing paper work, data evaluation and todays exam - 35 minutes  Dustin Flock M.D on 09/18/2016 at 12:48 PM  [facility]   Office  [phone]

## 2016-11-04 DEATH — deceased

## 2018-09-04 IMAGING — CR DG CHEST 2V
2 series · 2 of 2 positions shown · non-contrast
Comparison: 07/28/2009 chest radiograph.

CLINICAL DATA: Fall, pneumonia, possible sepsis

EXAM:
CHEST  2 VIEW

[chest lat]
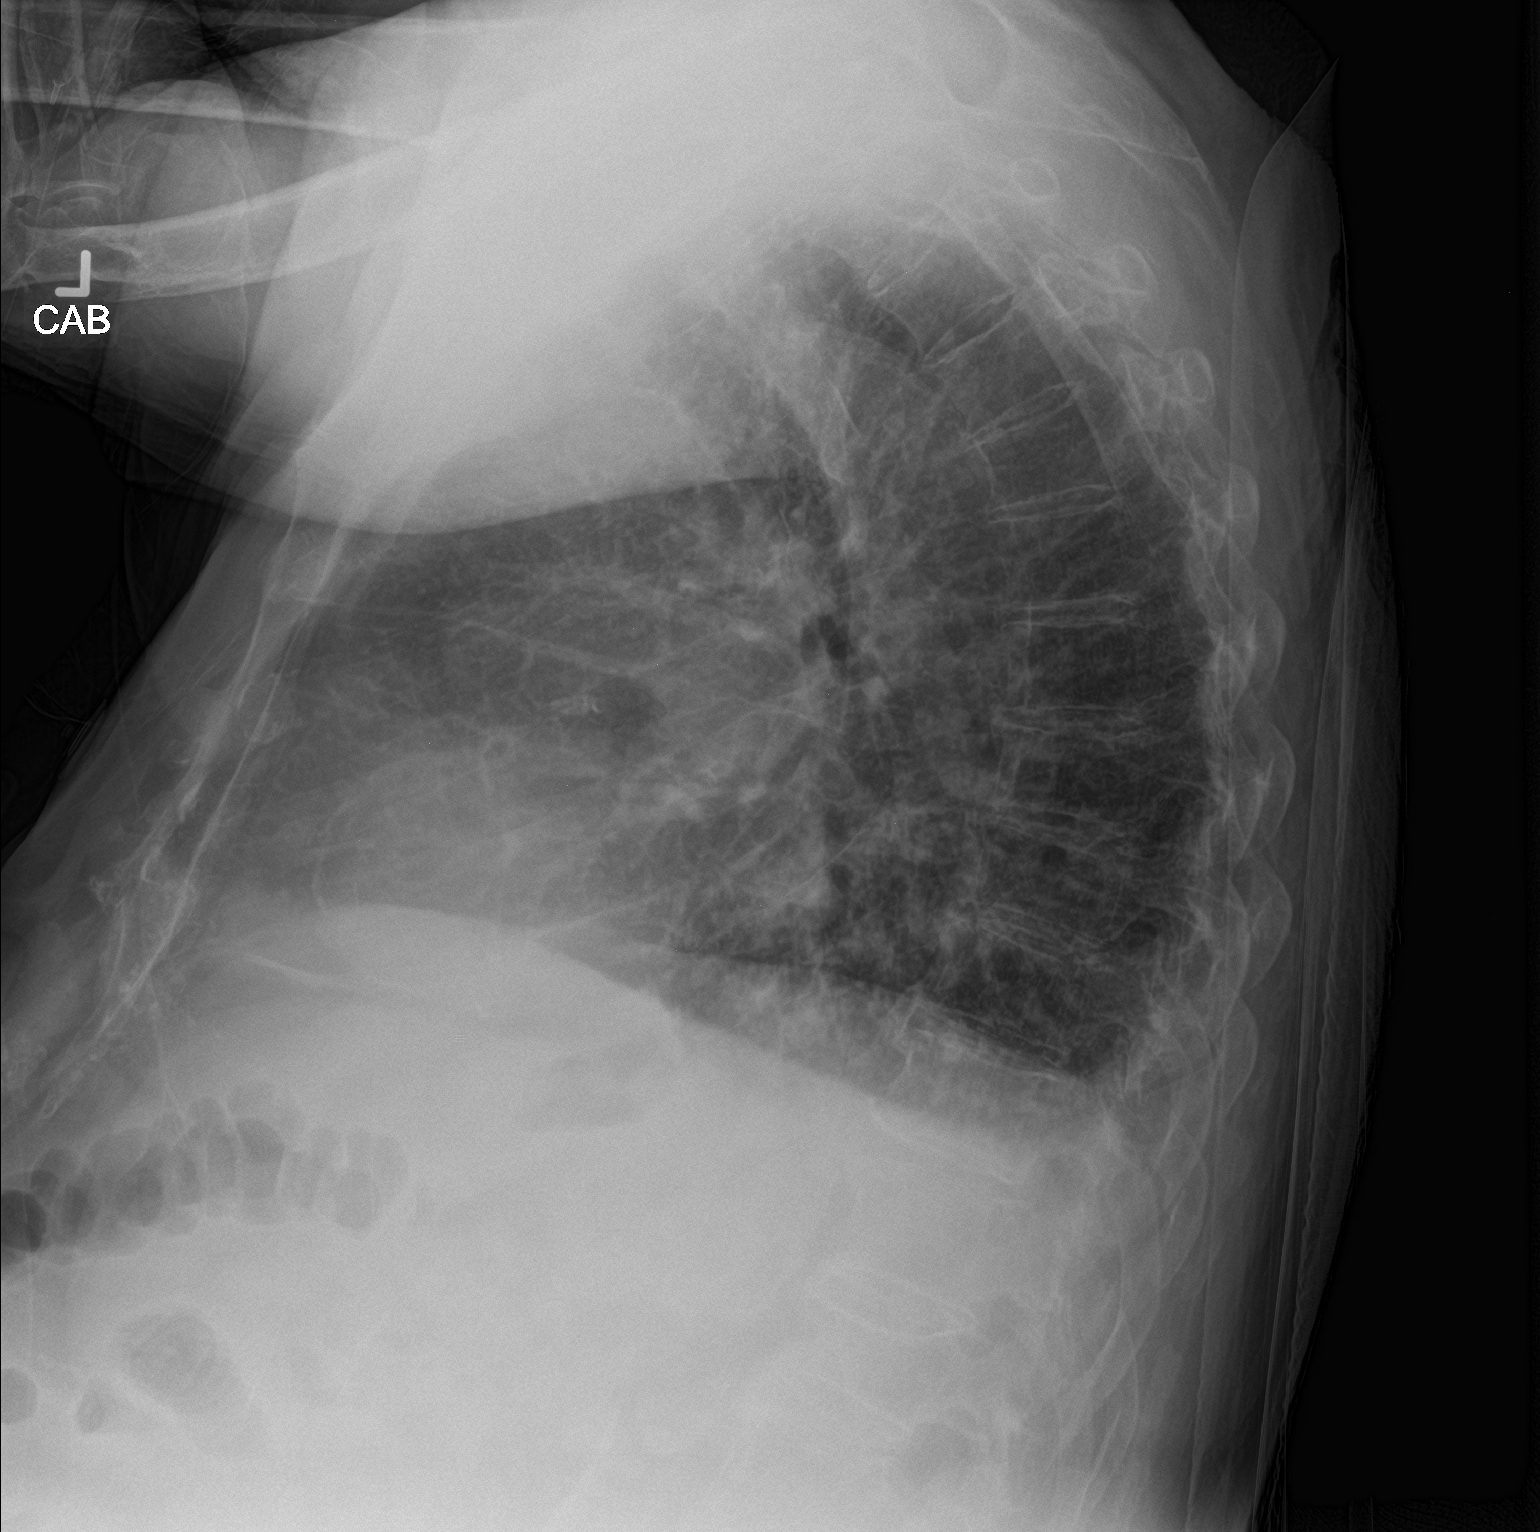

[chest ap]
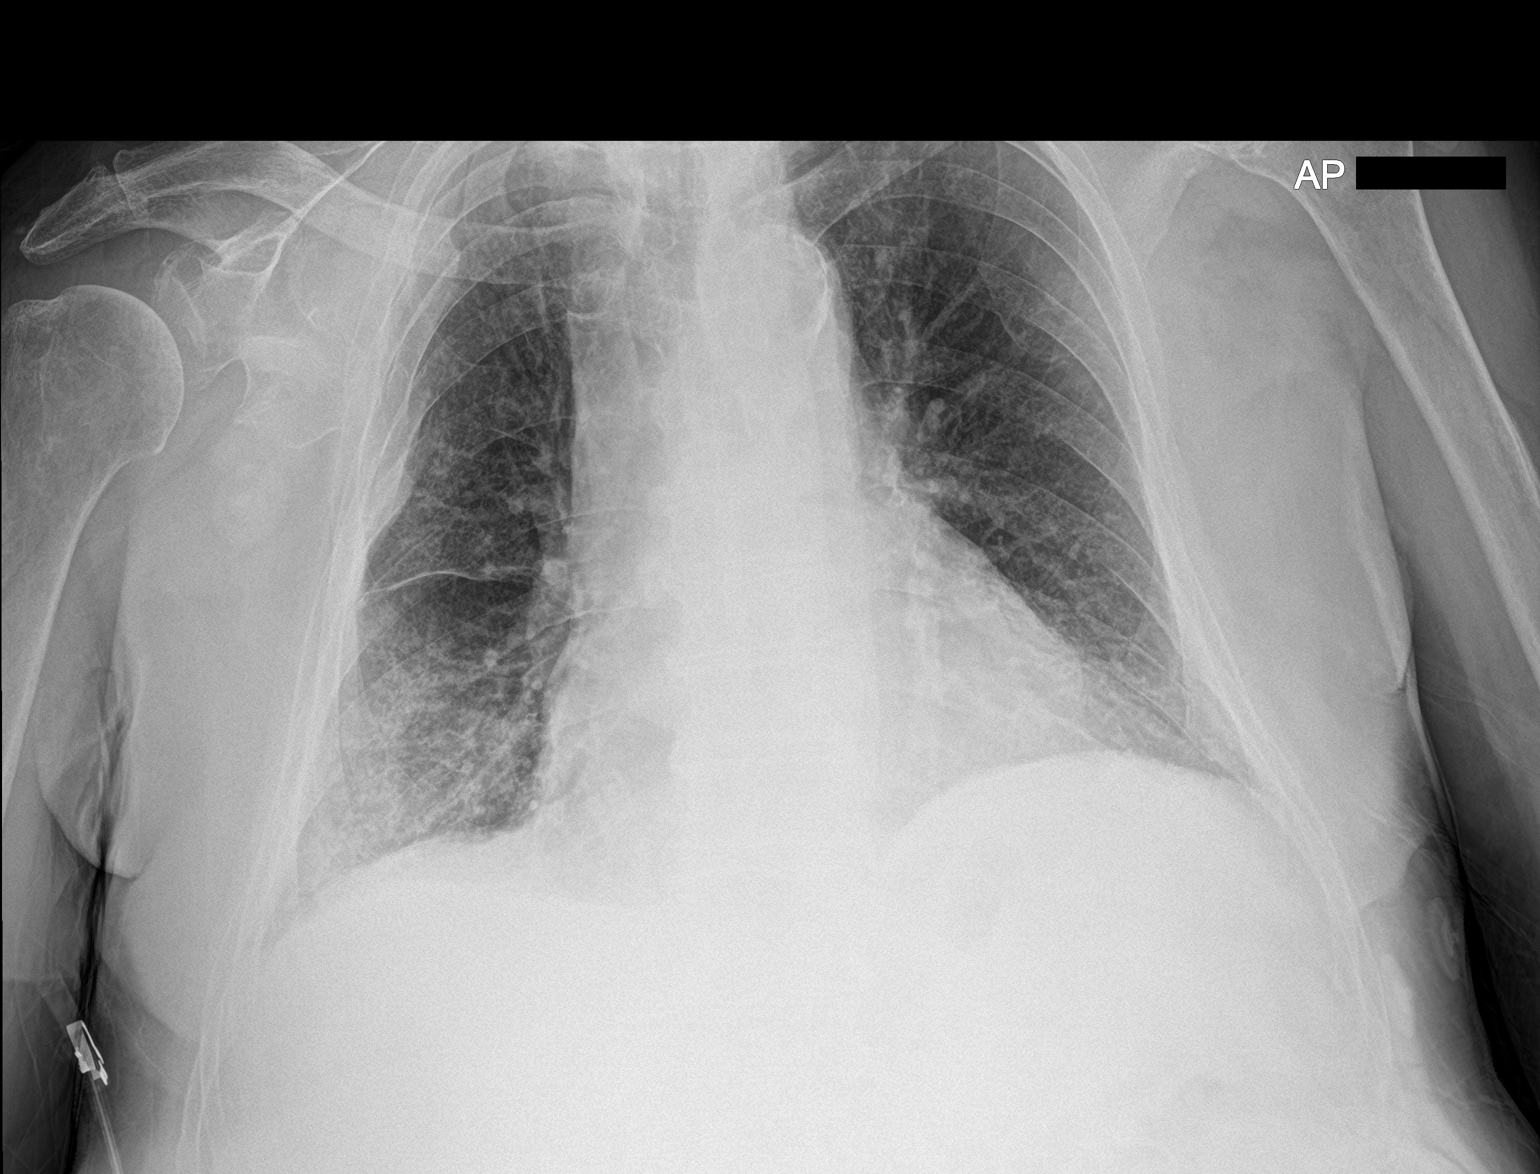

[2 of 2 positions shown; findings below may reference images not displayed]

FINDINGS: Stable cardiomediastinal silhouette with top-normal heart size and
aortic atherosclerosis. No pneumothorax. No pleural effusion. Patchy
consolidation at the right lung base. No pulmonary edema. Healed
deformities in the lateral right ribs. Deformity in the right
scapular body appears healed.
IMPRESSION: Patchy right lung base consolidation compatible with pneumonia.
Follow-up chest imaging to resolution recommended.

## 2018-09-05 IMAGING — DX DG CHEST 1V PORT
1 series · 1 of 1 positions shown · non-contrast
Comparison: 09/12/2016 and prior chest radiographs.

CLINICAL DATA: Shortness of breath.

EXAM:
PORTABLE CHEST 1 VIEW

[chest ap]
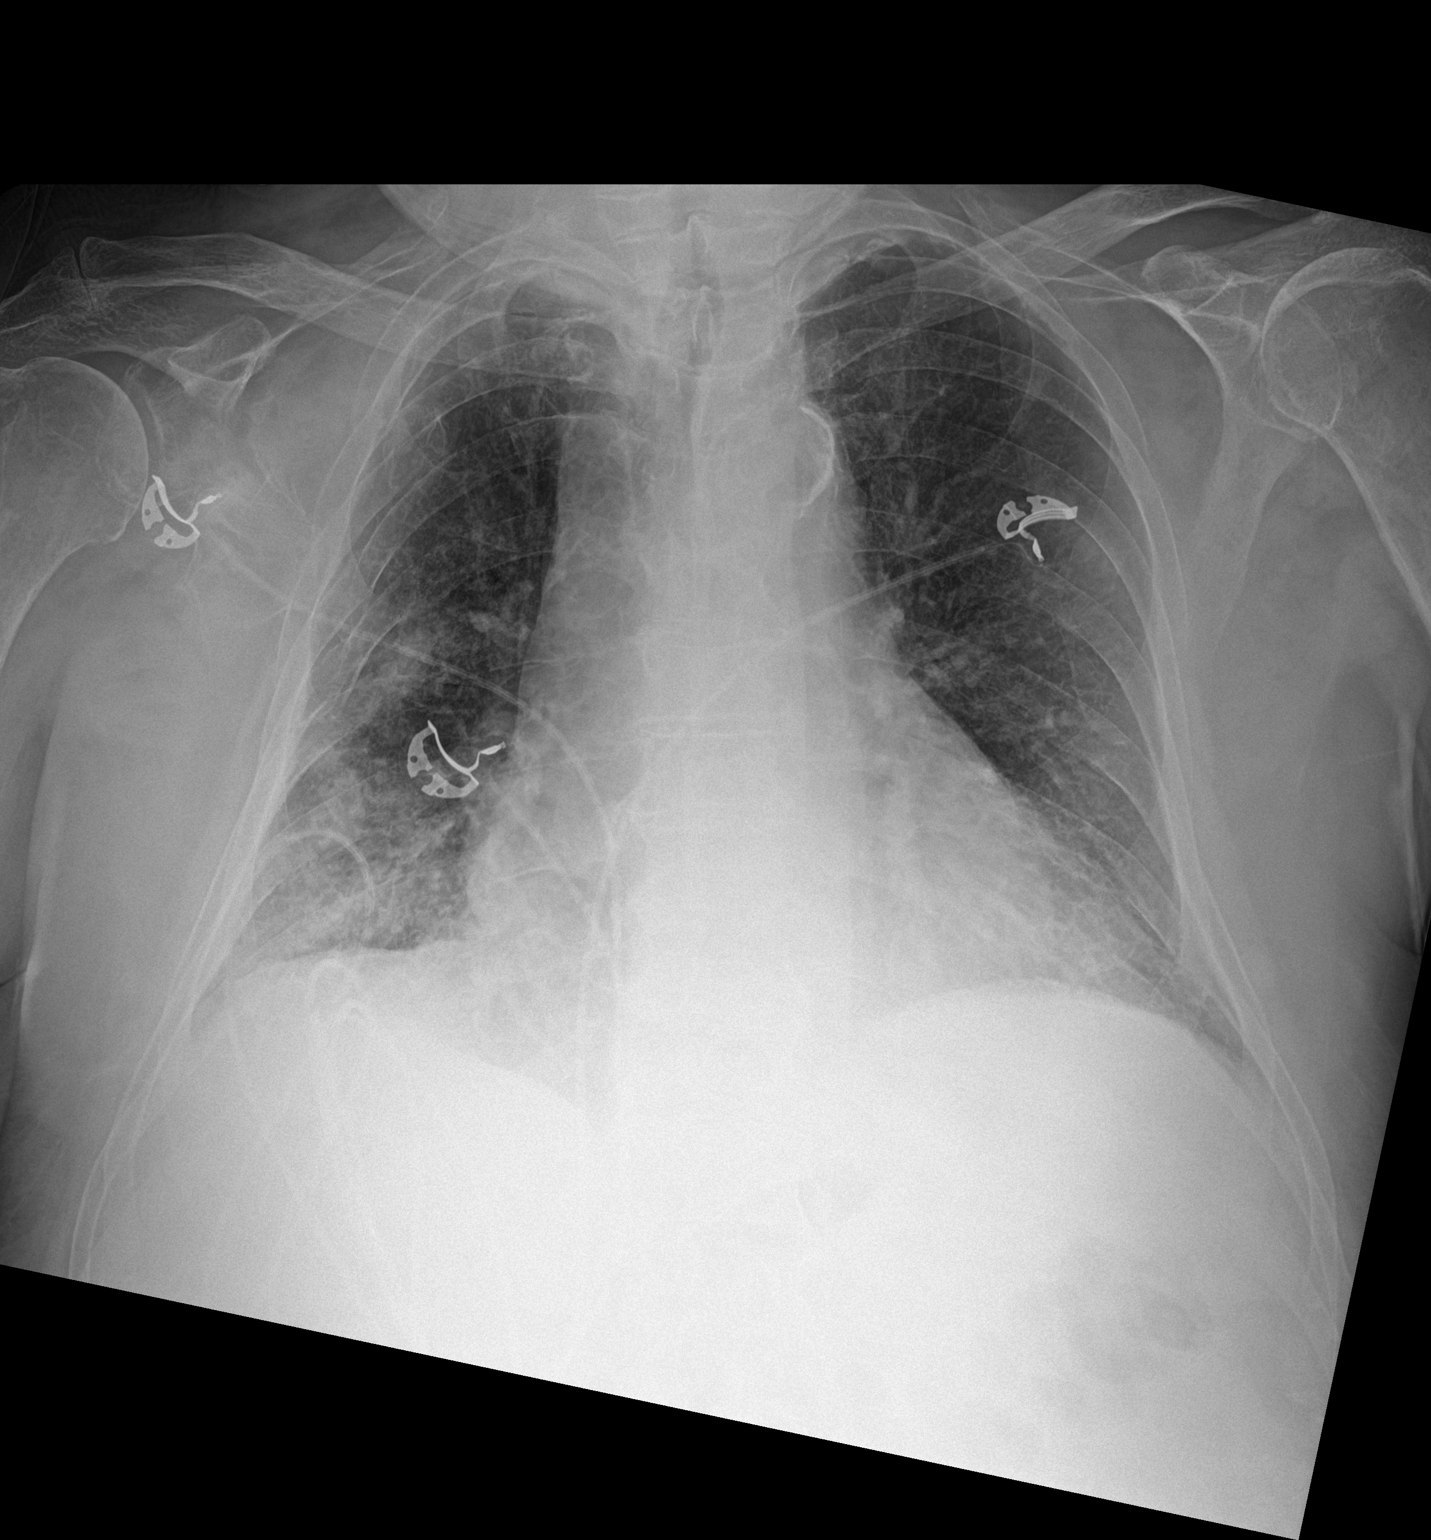

[1 of 1 positions shown; findings below may reference images not displayed]

FINDINGS: The cardiac silhouette is enlarged. Mediastinal contours appear
intact. Calcific atherosclerotic disease of the aorta.

There is no evidence of pneumothorax. Patchy airspace consolidation
with peripheral predominance are seen in bilateral lower lungs and
right mid lung field. Biapical pleural thickening.

Probably chronic fractures of several right-sided ribs with
subsequent posttraumatic concave right-sided chest deformity. Soft
tissues are grossly normal.
IMPRESSION: Bilateral lower lower lobes and right middle lobe patchy airspace
consolidation. This may represent multifocal pneumonia, possibly
aspiration pneumonia. Please follow-up to resolution to exclude
underlying pulmonary masses.

Enlarged cardiac silhouette and calcific atherosclerotic disease of
the aorta.
